# Patient Record
Sex: Male | Born: 1975 | Race: White | Hispanic: No | Marital: Married | Smoking: Current every day smoker
Health system: Southern US, Community
[De-identification: ages and names within clinical notes are randomized; demographics above are authoritative.]

## PROBLEM LIST (undated history)

## (undated) DIAGNOSIS — M5136 Other intervertebral disc degeneration, lumbar region: Secondary | ICD-10-CM

## (undated) DIAGNOSIS — M51369 Other intervertebral disc degeneration, lumbar region without mention of lumbar back pain or lower extremity pain: Secondary | ICD-10-CM

## (undated) HISTORY — PX: JOINT REPLACEMENT: SHX530

## (undated) HISTORY — PX: APPENDECTOMY: SHX54

---

## 2005-04-03 ENCOUNTER — Emergency Department: Payer: Self-pay | Admitting: Unknown Physician Specialty

## 2009-02-09 ENCOUNTER — Emergency Department: Payer: Self-pay | Admitting: Internal Medicine

## 2010-01-20 ENCOUNTER — Emergency Department: Payer: Self-pay | Admitting: Emergency Medicine

## 2010-12-06 ENCOUNTER — Emergency Department: Payer: Self-pay | Admitting: Unknown Physician Specialty

## 2012-11-14 ENCOUNTER — Emergency Department: Payer: Self-pay | Admitting: Emergency Medicine

## 2013-01-22 ENCOUNTER — Emergency Department: Payer: Self-pay | Admitting: Emergency Medicine

## 2013-04-16 ENCOUNTER — Emergency Department: Payer: Self-pay | Admitting: Internal Medicine

## 2013-08-06 ENCOUNTER — Emergency Department: Payer: Self-pay | Admitting: Emergency Medicine

## 2013-10-27 ENCOUNTER — Emergency Department: Payer: Self-pay | Admitting: Emergency Medicine

## 2014-02-09 ENCOUNTER — Emergency Department (HOSPITAL_COMMUNITY)
Admission: EM | Admit: 2014-02-09 | Discharge: 2014-02-09 | Disposition: A | Discharge status: Home / Self Care | Payer: Self-pay | Attending: Emergency Medicine | Admitting: Emergency Medicine

## 2014-02-09 ENCOUNTER — Encounter (HOSPITAL_COMMUNITY): Payer: Self-pay | Admitting: Emergency Medicine

## 2014-02-09 DIAGNOSIS — R269 Unspecified abnormalities of gait and mobility: Secondary | ICD-10-CM | POA: Insufficient documentation

## 2014-02-09 DIAGNOSIS — F17200 Nicotine dependence, unspecified, uncomplicated: Secondary | ICD-10-CM | POA: Insufficient documentation

## 2014-02-09 DIAGNOSIS — IMO0002 Reserved for concepts with insufficient information to code with codable children: Secondary | ICD-10-CM | POA: Insufficient documentation

## 2014-02-09 DIAGNOSIS — M5417 Radiculopathy, lumbosacral region: Secondary | ICD-10-CM

## 2014-02-09 MED ORDER — PREDNISONE 20 MG PO TABS: ORAL_TABLET | ORAL | Status: DC

## 2014-02-09 MED ORDER — OXYCODONE-ACETAMINOPHEN 5-325 MG PO TABS: 1.0000 | ORAL_TABLET | Freq: Four times a day (QID) | ORAL | Status: DC | PRN

## 2014-02-09 MED ORDER — METHOCARBAMOL 500 MG PO TABS: 1000.0000 mg | ORAL_TABLET | Freq: Four times a day (QID) | ORAL | Status: DC

## 2014-02-09 MED ORDER — OXYCODONE-ACETAMINOPHEN 5-325 MG PO TABS
1.0000 | ORAL_TABLET | Freq: Once | ORAL | Status: AC
  Administered 2014-02-09: 1 via ORAL
  Filled 2014-02-09: qty 1
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
## 2014-02-09 NOTE — Discharge Instructions (Signed)
Please read and follow all provided instructions.  Your diagnoses today include:  1. Lumbosacral radiculopathy     Tests performed today include:  Vital signs - see below for your results today  Medications prescribed:   Percocet (oxycodone/acetaminophen) - narcotic pain medication  DO NOT drive or perform any activities that require you to be awake and alert because this medicine can make you drowsy. BE VERY CAREFUL not to take multiple medicines containing Tylenol (also called acetaminophen). Doing so can lead to an overdose which can damage your liver and cause liver failure and possibly death.   Robaxin (methocarbamol) - muscle relaxer medication  DO NOT drive or perform any activities that require you to be awake and alert because this medicine can make you drowsy.    Prednisone - steroid medicine   It is best to take this medication in the morning to prevent sleeping problems. If you are diabetic, monitor your blood sugar closely and stop taking Prednisone if blood sugar is over 300. Take with food to prevent stomach upset.   Take any prescribed medications only as directed.  Home care instructions:   Follow any educational materials contained in this packet  Please rest, use ice or heat on your back for the next several days  Do not lift, push, pull anything more than 10 pounds for the next week  Follow-up instructions: Please follow-up with your primary care provider at the [facility] in the next 1 week for further evaluation of your symptoms.   Return instructions:  SEEK IMMEDIATE MEDICAL ATTENTION IF YOU HAVE:  New numbness, tingling, weakness, or problem with the use of your arms or legs  Severe back pain not relieved with medications  Loss control of your bowels or bladder  Increasing pain in any areas of the body (such as chest or abdominal pain)  Shortness of breath, dizziness, or fainting.   Worsening nausea (feeling sick to your stomach), vomiting, fever,  or sweats  Any other emergent concerns regarding your health   Additional Information:  Your vital signs today were: BP 134/86   Pulse 83   Temp(Src) 98.7 F (37.1 C) (Oral)   Resp 15   Ht 5\' 11"  (1.803 m)   Wt 186 lb 3.2 oz (84.46 kg)   BMI 25.98 kg/m2   SpO2 99% If your blood pressure (BP) was elevated above 135/85 this visit, please have this repeated by your doctor within one month. --------------

## 2014-02-09 NOTE — ED Notes (Signed)
PT reports Pain radiates down Lt leg to foot and LT small is numb . Pt also reports increased pain to back when sneezing or coughing.
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
## 2014-02-09 NOTE — ED Provider Notes (Signed)
Medical screening examination/treatment/procedure(s) were performed by non-physician practitioner and as supervising physician I was immediately available for consultation/collaboration.   EKG Interpretation None        Enid SkeensJoshua M Isahi Godwin, MD 02/09/14 1536
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
## 2014-02-09 NOTE — ED Notes (Signed)
Declined W/C at D/C and was escorted to lobby by RN. 

## 2014-02-16 ENCOUNTER — Emergency Department (HOSPITAL_COMMUNITY)
Admission: EM | Admit: 2014-02-16 | Discharge: 2014-02-16 | Discharge status: Against Medical Advice | Payer: Self-pay | Attending: Emergency Medicine | Admitting: Emergency Medicine

## 2014-02-16 ENCOUNTER — Encounter (HOSPITAL_COMMUNITY): Payer: Self-pay | Admitting: Emergency Medicine

## 2014-02-16 DIAGNOSIS — Y929 Unspecified place or not applicable: Secondary | ICD-10-CM | POA: Insufficient documentation

## 2014-02-16 DIAGNOSIS — F17200 Nicotine dependence, unspecified, uncomplicated: Secondary | ICD-10-CM | POA: Insufficient documentation

## 2014-02-16 DIAGNOSIS — Y939 Activity, unspecified: Secondary | ICD-10-CM | POA: Insufficient documentation

## 2014-02-16 DIAGNOSIS — T6391XA Toxic effect of contact with unspecified venomous animal, accidental (unintentional), initial encounter: Secondary | ICD-10-CM | POA: Insufficient documentation

## 2014-02-16 DIAGNOSIS — T63461A Toxic effect of venom of wasps, accidental (unintentional), initial encounter: Secondary | ICD-10-CM | POA: Insufficient documentation

## 2014-02-16 MED ORDER — ONDANSETRON 4 MG PO TBDP
8.0000 mg | ORAL_TABLET | Freq: Once | ORAL | Status: AC
  Administered 2014-02-16: 8 mg via ORAL

## 2014-02-16 MED ORDER — OXYCODONE-ACETAMINOPHEN 5-325 MG PO TABS: 1.0000 | ORAL_TABLET | Freq: Once | ORAL | Status: DC

## 2014-02-16 MED ORDER — ONDANSETRON 4 MG PO TBDP
ORAL_TABLET | ORAL | Status: AC
  Filled 2014-02-16: qty 2

## 2014-02-16 NOTE — ED Notes (Signed)
Pt reports being stung by appx 15 bees at 1445. States ever since he has generalized abdominal pain with N/V. 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 Redness noted to sting sites with minor swelling. States took 100 mg Benadryl at 1400. Pt speaks in complete sentences, denies any swelling to tongue or airway. NAD. AO x4.

## 2014-03-08 ENCOUNTER — Encounter (HOSPITAL_COMMUNITY): Payer: Self-pay | Admitting: Emergency Medicine

## 2014-03-08 ENCOUNTER — Emergency Department (HOSPITAL_COMMUNITY)
Admission: EM | Admit: 2014-03-08 | Discharge: 2014-03-08 | Disposition: A | Discharge status: Home / Self Care | Payer: Self-pay | Attending: Emergency Medicine | Admitting: Emergency Medicine

## 2014-03-08 DIAGNOSIS — R209 Unspecified disturbances of skin sensation: Secondary | ICD-10-CM | POA: Insufficient documentation

## 2014-03-08 DIAGNOSIS — Z9660 Presence of unspecified orthopedic joint implant: Secondary | ICD-10-CM | POA: Insufficient documentation

## 2014-03-08 DIAGNOSIS — IMO0002 Reserved for concepts with insufficient information to code with codable children: Secondary | ICD-10-CM | POA: Insufficient documentation

## 2014-03-08 DIAGNOSIS — M545 Low back pain, unspecified: Secondary | ICD-10-CM | POA: Insufficient documentation

## 2014-03-08 DIAGNOSIS — M79609 Pain in unspecified limb: Secondary | ICD-10-CM | POA: Insufficient documentation

## 2014-03-08 DIAGNOSIS — F17200 Nicotine dependence, unspecified, uncomplicated: Secondary | ICD-10-CM | POA: Insufficient documentation

## 2014-03-08 DIAGNOSIS — M5416 Radiculopathy, lumbar region: Secondary | ICD-10-CM

## 2014-03-08 DIAGNOSIS — Z79899 Other long term (current) drug therapy: Secondary | ICD-10-CM | POA: Insufficient documentation

## 2014-03-08 MED ORDER — HYDROMORPHONE HCL PF 1 MG/ML IJ SOLN
1.0000 mg | Freq: Once | INTRAMUSCULAR | Status: AC
  Administered 2014-03-08: 1 mg via INTRAVENOUS
  Filled 2014-03-08: qty 1

## 2014-03-08 MED ORDER — OXYCODONE-ACETAMINOPHEN 5-325 MG PO TABS: 1.0000 | ORAL_TABLET | Freq: Four times a day (QID) | ORAL | Status: DC | PRN

## 2014-03-08 NOTE — ED Notes (Signed)
Pt states he was moving furniture last night when he felt something pop in his back. He has hx of herniated disk in his back. Pt states he took a hot bath and some tylenol last night with no relief. Pt states pain is in both hips and down both legs with tingling in both feet and the left foot is almost numb.

## 2014-03-08 NOTE — ED Provider Notes (Signed)
CSN: [id number]     Arrival date & time 03/08/14  [id number] History   First MD Initiated Contact with Patient 03/08/14 0900     Chief Complaint  Patient presents with  . Leg Pain     (Consider location/radiation/quality/duration/timing/severity/associated sxs/prior Treatment) HPI Complains of low back pain radiating to both legs with tingling in left foot several weeks ago became worse last night after he pushed a heavy dresser. No loss of bladder or bowel control no other associated symptoms he treated himself with Tylenol and ibuprofen, without relief. No fever. No other associated symptoms pain is worse with movement improved with remaining still. History reviewed. No pertinent past medical history. Past Surgical History  Procedure Laterality Date  . Appendectomy    . Joint replacement     lumbar radiculopathy History reviewed. No pertinent family history. History  Substance Use Topics  . Smoking status: Current Every Day Smoker -- 0.50 packs/day    Types: Cigarettes  . Smokeless tobacco: Not on file  . Alcohol Use: No   no illicit drug use  Review of Systems  Constitutional: Negative.   HENT: Negative.   Respiratory: Negative.   Cardiovascular: Negative.   Gastrointestinal: Negative.   Musculoskeletal: Positive for back pain.  Skin: Negative.   Neurological: Positive for numbness.  Psychiatric/Behavioral: Negative.   All other systems reviewed and are negative.     Allergies  Review of patient's allergies indicates no known allergies.  Home Medications   Prior to Admission medications   Medication Sig Start Date End Date Taking? Authorizing Provider  acetaminophen (TYLENOL) 500 MG tablet Take 1,000 mg by mouth every 6 (six) hours as needed for moderate pain.   Yes Historical Provider, MD   BP 119/86  Pulse 99  Temp(Src) 98.2 F (36.8 C) (Oral)  SpO2 99% Physical Exam  Nursing note and vitals reviewed. Constitutional: He appears well-developed and  well-nourished. He appears distressed.  Appears uncomfortable Glasgow Coma Score 15  HENT:  Head: Normocephalic and atraumatic.  Eyes: Conjunctivae are normal. Pupils are equal, round, and reactive to light.  Neck: Neck supple. No tracheal deviation present. No thyromegaly present.  Cardiovascular: Normal rate and regular rhythm.   No murmur heard. Pulmonary/Chest: Effort normal and breath sounds normal.  Abdominal: Soft. Bowel sounds are normal. He exhibits no distension. There is no tenderness.  Musculoskeletal: Normal range of motion. He exhibits tenderness. He exhibits no edema.  Tender over lumbar area. No tenderness over thoracic or cervical areas at all 4 extremities no redness or tenderness neurovascular intact  Neurological: He is alert. He has normal reflexes. Coordination normal.  DTRs symmetric bilaterally knee jerk ankle jerk and biceps was ordered bilaterally. Walks without limp. Walks slightly flexed at waist  Skin: Skin is warm and dry. No rash noted.  Psychiatric: He has a normal mood and affect.    ED Course  Procedures (including critical care time) Labs Review Labs Reviewed - No data to display 11:10 AM pain improved after treatment with intravenous hydromorphone. Requesting more pain medicine. Additional hydromorphone ordered. Imaging Review No results found.   EKG Interpretation None     11:40 AM patient feels much improved and ready to go home MDM  Prescription Percocet, referral to wellness Center and resource guide. Final diagnoses:  None   diagnosis lumbar radiculopathy      Doug SouSam Madisan Bice, MD 03/08/14 1148

## 2014-03-08 NOTE — Discharge Instructions (Signed)
Take Advil for mild pain or the pain medicine prescribed for bad pain. Call the wellness Center tomorrow or any of the numbers on the resource guide to get a primary care physician. Or you can continue to try to get a primary care physician with the [facility]   Emergency Department Resource Guide 1) Find a Doctor and Pay Out of Pocket Although you won't have to find out who is covered by your insurance plan, it is a good idea to ask around and get recommendations. You will then need to call the office and see if the doctor you have chosen will accept you as a new patient and what types of options they offer for patients who are self-pay. Some doctors offer discounts or will set up payment plans for their patients who do not have insurance, but you will need to ask so you aren't surprised when you get to your appointment.  2) Contact Your Local Health Department Not all health departments have doctors that can see patients for sick visits, but many do, so it is worth a call to see if yours does. If you don't know where your local health department is, you can check in your phone book. The CDC also has a tool to help you locate your state's health department, and many state websites also have listings of all of their local health departments.  3) Find a Walk-in Clinic If your illness is not likely to be very severe or complicated, you may want to try a walk in clinic. These are popping up all over the country in pharmacies, drugstores, and shopping centers. They're usually staffed by nurse practitioners or physician assistants that have been trained to treat common illnesses and complaints. They're usually fairly quick and inexpensive. However, if you have serious medical issues or chronic medical problems, these are probably not your best option.  No Primary Care Doctor: - Call Health Connect at  [phone] - they can help you locate a primary care doctor that  accepts your insurance, provides certain  services, etc. - Physician Referral Service- [phone]  Chronic Pain Problems: Organization         Address  Phone   Notes  [facility]  [phone] Patients need to be referred by their primary care doctor.   Medication Assistance: Organization         Address  Phone   Notes  [facility] [address] [place], [place] [address] [phone] --Must be a resident of [place] -- Must have NO insurance coverage whatsoever (no Medicaid/ Medicare, etc.) -- The pt. MUST have a primary care doctor that directs their care regularly and follows them in the community   MedAssist  [phone]   [employer]  [phone]    Agencies that provide inexpensive medical care: Organization         Address  Phone   Notes  [facility]  [phone]   [facility]    [phone]   [facility] [address] [place], [place] [address] [phone]   [facility] [address], [place] [phone]   Planned Parenthood    [phone]   [facility]    [phone]   [facility]  [address], [place] Phone:  [phone]) [phone], Fax:  [phone] Hours of Operation:  9 am -  6 pm, M-F.  Also accepts Medicaid/Medicare and self-pay.  [facility] [place], [address], [place] Phone: [phone], Fax: [phone]. Hours of Operation:  8:30 am - 5:30 pm, M-F.  Also accepts Medicaid and self-pay.  [facility] [place] [address] [place] Phone: [phone]   [place], [place], [place] [phone], Ext. [phone] Mondays & Thursdays: 7-9 AM.  First 15 patients are seen on a first come, first serve basis.    [place] Providers:  Organization         Address  Phone   Notes  [facility] [address], [place] [phone] Also accepts self-pay patients.  [facility] [address], [place]  [phone]   [place], [address], [place] [phone]   [facility] [address] [place] [phone]   Lucianne Lei [address], [place]   [phone] Only accepts [place] Access [place] patients after they have their name applied to their card.   Self-Pay (no insurance) in [facility]:  Organization         Address  Phone   Notes  Sickle Cell Patients, [facility] [place] [phone]   [facility] [place] [phone]   [facility] [place]  [place], [place],  [phone]   [facility]/Dr. Osei-Bonsu  [address] [place] or [address], [place] [phone] Phone number for both [place] and [place] locations is the same.  [facility] [address] [place] [phone]   [facility] [address], [place] or [address] [phone]   [facility] [address], [place] [phone], phone; [phone], fax Sees patients 1st and 3rd Saturday of every month.  Must not qualify for public or private insurance (i.e. Medicaid, Medicare, [place] Health Choice, Veterans' Benefits)  Household income should be no more than 200% of the poverty level The clinic cannot treat you if you are pregnant or think you are pregnant  Sexually transmitted diseases are not treated at the clinic.    Dental Care: Organization         Address  Phone  Notes  [facility] Department of [facility] [place] [phone] Accepts  children up to age 4 who are enrolled in [place] or [place]; pregnant women with a Medicaid card; and children who have applied for Medicaid or [place] Health Choice, but were declined, whose parents can pay a reduced fee at time of service.  [facility]  [address], [place] [phone] Accepts children up to age 26 who are enrolled in [place] or [place]; pregnant women with a Medicaid card; and children who have applied for Medicaid or  Health Choice, but were declined, whose parents can pay a reduced fee at time of service.  [place] [facility] Access PROGRAM  [place] [phone] Patients are seen by appointment only. Walk-ins are not accepted. [place] will see patients 72 years of age and  older. Monday - Tuesday (8am-5pm) Most Wednesdays (8:30-5pm) $30 per visit, cash only  [facility] [employer] PROGRAM  [address], [facility] [phone] Patients are seen by appointment only. Walk-ins are not accepted. [place] will see patients 87 years of age and older. One Wednesday Evening (Monthly: Volunteer Based).  $30 per visit, cash only  [place]  [phone] for adults; Children under age 77, call Graduate Pediatric Dentistry at [phone]. Children aged 21-14, please call [phone] to request a pediatric application.  Dental services are provided in all areas of dental care including fillings, crowns and bridges, complete and partial dentures, implants, gum treatment, root canals, and extractions. Preventive care is also provided. Treatment is provided to both adults and children. Patients are selected via a lottery and there is often a waiting list.   [facility] [address], [place]  [phone] [email or web address]   [facility] [address] [place], [place] [phone], Ext. [phone] Second and  Fourth Thursday of each month, opens at 6:30 AM; Clinic ends at 9 AM.  Patients are seen on a first-come first-served basis, and a limited number are seen during each clinic.   [facility]  [address] Hillard Danker [place], [place] [phone]   Eligibility Requirements You must have lived in [place], [place], or [place] counties for at least the last three months.   You cannot be eligible for state or federal sponsored [employer], including [employer], [place], or [employer].   You generally cannot be eligible for healthcare insurance through your employer.    How to apply: Eligibility screenings are held every Tuesday and Wednesday afternoon from 1:00 pm until 4:00 pm. You do not need an appointment for the interview!  [facility] [address] Olivia Lopez de [place], [place]   [place]  [place] Department  [place]  [phone]    Behavioral Health Resources in the Community: Intensive Outpatient Programs Organization         Address  Phone  Notes  Bryant Seguin. [address], [place], [place] [phone]   [facility] Outpatient [address], [place], [place]   ADS: [facility] Drug S[facility]cs [address] [place], [place]   [place] [address]  [place], [place] or [phone]   Substance Abuse Resources Organization         Address  Phone  Notes  Alcohol and Drug Services  [phone]   [place]  [phone]   [facility] [place]   Chinita Pester  [phone]   Residential & Outpatient Substance Abuse Program  [phone]   Psychological Services Organization         Address  Phone  Notes  [facility] [place]  Vittoria Noreen  [phone]   [place]  [address] [place] [phone] or [phone]    Mobile Crisis Teams Organization         Address  Phone  Notes  [facility], Mobile Crisis Care Unit  [phone]   [facility]  [address] [place], [place]   [facility] [address], [place] [phone]    Self-Help/Support Groups Organization         Address  Phone             Notes  [facility] [place] - variety of support groups  [place] Call for more information  Narcotics Anonymous (NA), [facility] [address], [employer]   2 meetings at this location   Special educational needs teacher         Address  Phone  Notes  ASAP Residential Treatment [place],    [place]  [phone]   [facility]  [address], [place] [address], [place], [place]   [place], [place] [phone] Admissions: 8am-3pm M-F  Incentives Substance [place] [address],    [place], [place] [phone]   [facility] [address] [place], [place], [place]   [facility] [address],  [place], [place]   [facility] - Intensive Outpatient [address], [place], [place]   [facility] [place] ([address] [place]  [place], [place] [phone] or [phone]   [facility] (RTS) [address] [place], [place] Accepts Medicaid  [facility] [place] [address],  [place] [phone] Substance Abuse/Addiction Treatment   [facility] Lopez Organization         Address  Phone  Notes  [facility]  [phone]   Domenic Schwab, PhD [address] Arlis Porta [place], [place]   [phone] or [phone]   [place], [place] [phone]   [facility] [address] [place], [place] [phone] Insurance/Medicaid/sponsorship through [facility] and Families [address], [address]                                    [place], [place] [phone] [place] [address], [place] [phone]    Dr. Adele Schilder  [phone]   [facility] Dept. 1) [address] [place] 2) [place] 3)  [place] [address], [place] [phone]  [phone]   [place] [phone] or [phone] (After Hours)

## 2014-10-06 ENCOUNTER — Encounter (HOSPITAL_COMMUNITY): Payer: Self-pay | Admitting: *Deleted

## 2014-10-06 ENCOUNTER — Emergency Department (HOSPITAL_COMMUNITY)
Admission: EM | Admit: 2014-10-06 | Discharge: 2014-10-06 | Disposition: A | Discharge status: Home / Self Care | Payer: Self-pay | Attending: Emergency Medicine | Admitting: Emergency Medicine

## 2014-10-06 DIAGNOSIS — G8929 Other chronic pain: Secondary | ICD-10-CM | POA: Insufficient documentation

## 2014-10-06 DIAGNOSIS — R202 Paresthesia of skin: Secondary | ICD-10-CM | POA: Insufficient documentation

## 2014-10-06 DIAGNOSIS — M5416 Radiculopathy, lumbar region: Secondary | ICD-10-CM | POA: Insufficient documentation

## 2014-10-06 DIAGNOSIS — Z720 Tobacco use: Secondary | ICD-10-CM | POA: Insufficient documentation

## 2014-10-06 LAB — CBG MONITORING, ED: Glucose-Capillary: 105 mg/dL — ABNORMAL HIGH (ref 70–99)

## 2014-10-06 MED ORDER — ORPHENADRINE CITRATE 30 MG/ML IJ SOLN
60.0000 mg | Freq: Two times a day (BID) | INTRAMUSCULAR | Status: DC
Start: 2014-10-06 — End: 2014-10-06

## 2014-10-06 MED ORDER — CYCLOBENZAPRINE HCL 10 MG PO TABS
10.0000 mg | ORAL_TABLET | Freq: Once | ORAL | Status: AC
  Administered 2014-10-06: 10 mg via ORAL
  Filled 2014-10-06: qty 1

## 2014-10-06 MED ORDER — HYDROMORPHONE HCL 1 MG/ML IJ SOLN
1.0000 mg | Freq: Once | INTRAMUSCULAR | Status: AC
  Administered 2014-10-06: 1 mg via INTRAMUSCULAR
  Filled 2014-10-06: qty 1

## 2014-10-06 MED ORDER — IBUPROFEN 600 MG PO TABS: 600.0000 mg | ORAL_TABLET | Freq: Three times a day (TID) | ORAL | Status: DC | PRN

## 2014-10-06 MED ORDER — IBUPROFEN 400 MG PO TABS
600.0000 mg | ORAL_TABLET | Freq: Once | ORAL | Status: AC
  Administered 2014-10-06: 600 mg via ORAL
  Filled 2014-10-06 (×2): qty 1

## 2014-10-06 MED ORDER — OXYCODONE-ACETAMINOPHEN 5-325 MG PO TABS: 1.0000 | ORAL_TABLET | ORAL | Status: DC | PRN

## 2014-10-06 NOTE — Discharge Instructions (Signed)

## 2014-10-06 NOTE — ED Notes (Signed)
Pt in from home c/o L leg & L foot numbness & tingling onset x 3 days, hx of the same, pt eval for similar symptoms & advised to return for worsening symptoms, pt denies bowel  & bladder incontinence, pt ambulatory

## 2014-10-06 NOTE — ED Notes (Signed)
MD at bedside. 
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
## 2014-10-06 NOTE — ED Notes (Signed)
CBG 105 

## 2014-12-28 ENCOUNTER — Emergency Department (HOSPITAL_COMMUNITY)
Admission: EM | Admit: 2014-12-28 | Discharge: 2014-12-28 | Disposition: A | Discharge status: Home / Self Care | Payer: Self-pay | Attending: Emergency Medicine | Admitting: Emergency Medicine

## 2014-12-28 ENCOUNTER — Encounter (HOSPITAL_COMMUNITY): Payer: Self-pay | Admitting: *Deleted

## 2014-12-28 DIAGNOSIS — Z720 Tobacco use: Secondary | ICD-10-CM | POA: Insufficient documentation

## 2014-12-28 DIAGNOSIS — M25511 Pain in right shoulder: Secondary | ICD-10-CM | POA: Insufficient documentation

## 2014-12-28 DIAGNOSIS — M5442 Lumbago with sciatica, left side: Secondary | ICD-10-CM | POA: Insufficient documentation

## 2014-12-28 MED ORDER — IBUPROFEN 800 MG PO TABS: 800.0000 mg | ORAL_TABLET | Freq: Three times a day (TID) | ORAL | Status: DC

## 2014-12-28 MED ORDER — METHOCARBAMOL 500 MG PO TABS: 500.0000 mg | ORAL_TABLET | Freq: Two times a day (BID) | ORAL | Status: DC

## 2014-12-28 NOTE — ED Notes (Signed)
Back pain started in July of 2015 . Pt has not been able to follow up with a neiro MD yet. 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

## 2014-12-28 NOTE — ED Provider Notes (Signed)
CSN: [id number]     Arrival date & time 12/28/14  2130 History   First MD Initiated Contact with Patient 12/28/14 0801     Chief Complaint  Patient presents with  . Shoulder Pain  . Back Pain  . Leg Pain     (Consider location/radiation/quality/duration/timing/severity/associated sxs/prior Treatment) HPI Comments: Patient presents today with a complaints of lower back pain and right shoulder pain.  He reports that the pain has been present for the past 10 months, but is progressively worsening.  He reports that the pain is worse when standing for long periods of time.  He denies acute injury or trauma.  Back pain radiates down the left leg.  Pain associated some some intermittent numbness/tingling of the left leg.  He has taken Advil and Aleve for the pain without relief.  He has been seen in the ED for the same in the past and given referral to Orthopedics, but he states that he has not seen the Orthopedist.  He denies saddle anaesthesia, bowel/bladder incontinence, fever, chills, or other urinary symptoms.    He is also complaining of pain of the right shoulder that has been present for several months.  No acute injury or trauma.  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  Pain is worse when he is at work painting.  Pain does not radiate.      The history is provided by the patient.    History reviewed. No pertinent past medical history. Past Surgical History  Procedure Laterality Date  . Appendectomy    . Joint replacement     History reviewed. No pertinent family history. History  Substance Use Topics  . Smoking status: Current Every Day Smoker -- 0.50 packs/day    Types: Cigarettes  . Smokeless tobacco: Not on file  . Alcohol Use: No    Review of Systems  All other systems reviewed and are negative.     Allergies  Review of patient's allergies indicates no known allergies.  Home Medications   Prior to Admission medications   Medication  Sig Start Date End Date Taking? Authorizing Provider  acetaminophen (TYLENOL) 500 MG tablet Take 1,000 mg by mouth every 6 (six) hours as needed for moderate pain.    Historical Provider, MD  ibuprofen (ADVIL,MOTRIN) 600 MG tablet Take 1 tablet (600 mg total) by mouth every 8 (eight) hours as needed. 10/06/14   Shaune Pollack, MD  oxyCODONE-acetaminophen (PERCOCET/ROXICET) 5-325 MG per tablet Take 1-2 tablets by mouth every 4 (four) hours as needed for severe pain. 10/06/14   Shaune Pollack, MD   BP 125/83 mmHg  Pulse 90  Temp(Src) 97.7 F (36.5 C) (Oral)  Resp 18  SpO2 100% Physical Exam  Constitutional: He appears well-developed and well-nourished.  HENT:  Head: Normocephalic and atraumatic.  Neck: Normal range of motion. Neck supple.  Cardiovascular: Normal rate, regular rhythm and normal heart sounds.   Pulses:      Radial pulses are 2+ on the right side, and 2+ on the left side.       Dorsalis pedis pulses are 2+ on the right side, and 2+ on the left side.  Pulmonary/Chest: Effort normal and breath sounds normal.  Musculoskeletal:       Right shoulder: He exhibits decreased range of motion and tenderness. He exhibits no swelling, no effusion and no deformity.       Lumbar back: He exhibits tenderness and bony tenderness. He exhibits no swelling,  no edema and no deformity.  Pain with abduction and flexion of the right shoulder.  No erythema, edema, or warmth of the shoulder.    Neurological: He is alert. He has normal strength. No sensory deficit. Gait normal.  Reflex Scores:      Patellar reflexes are 2+ on the right side and 2+ on the left side. Distal sensation of both feet intact Muscle strength 5/5 LE bilaterally  Skin: Skin is warm and dry.  Psychiatric: He has a normal mood and affect.  Nursing note and vitals reviewed.   ED Course  Procedures (including critical care time) Labs Review Labs Reviewed - No data to display  Imaging Review No results found.   EKG  Interpretation None      MDM   Final diagnoses:  None   Patient with back pain.  No neurological deficits and normal neuro exam.  Patient can walk but states is painful.  No loss of bowel or bladder control.  No concern for cauda equina.  No fever, night sweats, weight loss, h/o cancer, IVDU.  RICE protocol discussed with pt.  Patient also presenting with right shoulder pain.  No acute injury or trauma.  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  No signs of infection.  Patient given NSAIDs and referral to Orthopedics.  Patient stable for discharge.  Return precautions given.       Santiago GladHeather Yonael Tulloch, PA-C 12/28/14 2157  Richardean Canalavid H Yao, MD 12/29/14 [phone]

## 2014-12-28 NOTE — ED Notes (Signed)
Declined W/C at D/C and was escorted to lobby by RN. 

## 2015-02-11 ENCOUNTER — Emergency Department
Admission: EM | Admit: 2015-02-11 | Discharge: 2015-02-11 | Disposition: A | Discharge status: Home / Self Care | Payer: Self-pay | Attending: Emergency Medicine | Admitting: Emergency Medicine

## 2015-02-11 ENCOUNTER — Encounter: Payer: Self-pay | Admitting: Emergency Medicine

## 2015-02-11 ENCOUNTER — Emergency Department: Payer: Self-pay

## 2015-02-11 DIAGNOSIS — Y92828 Other wilderness area as the place of occurrence of the external cause: Secondary | ICD-10-CM | POA: Insufficient documentation

## 2015-02-11 DIAGNOSIS — S300XXA Contusion of lower back and pelvis, initial encounter: Secondary | ICD-10-CM | POA: Insufficient documentation

## 2015-02-11 DIAGNOSIS — Y9389 Activity, other specified: Secondary | ICD-10-CM | POA: Insufficient documentation

## 2015-02-11 DIAGNOSIS — Y998 Other external cause status: Secondary | ICD-10-CM | POA: Insufficient documentation

## 2015-02-11 DIAGNOSIS — W228XXA Striking against or struck by other objects, initial encounter: Secondary | ICD-10-CM | POA: Insufficient documentation

## 2015-02-11 DIAGNOSIS — Z720 Tobacco use: Secondary | ICD-10-CM | POA: Insufficient documentation

## 2015-02-11 HISTORY — DX: Other intervertebral disc degeneration, lumbar region without mention of lumbar back pain or lower extremity pain: M51.369

## 2015-02-11 HISTORY — DX: Other intervertebral disc degeneration, lumbar region: M51.36

## 2015-02-11 MED ORDER — CYCLOBENZAPRINE HCL 10 MG PO TABS: 10.0000 mg | ORAL_TABLET | Freq: Three times a day (TID) | ORAL | Status: DC | PRN

## 2015-02-11 MED ORDER — KETOROLAC TROMETHAMINE 60 MG/2ML IM SOLN
INTRAMUSCULAR | Status: AC
  Administered 2015-02-11: 60 mg via INTRAMUSCULAR
  Filled 2015-02-11: qty 2

## 2015-02-11 MED ORDER — KETOROLAC TROMETHAMINE 60 MG/2ML IM SOLN
60.0000 mg | Freq: Once | INTRAMUSCULAR | Status: AC
  Administered 2015-02-11: 60 mg via INTRAMUSCULAR

## 2015-02-11 MED ORDER — HYDROCODONE-ACETAMINOPHEN 5-325 MG PO TABS: 1.0000 | ORAL_TABLET | ORAL | Status: DC | PRN

## 2015-02-11 MED ORDER — HYDROCODONE-ACETAMINOPHEN 5-325 MG PO TABS
2.0000 | ORAL_TABLET | Freq: Once | ORAL | Status: AC
  Administered 2015-02-11: 2 via ORAL

## 2015-02-11 MED ORDER — IBUPROFEN 800 MG PO TABS: 800.0000 mg | ORAL_TABLET | Freq: Three times a day (TID) | ORAL | Status: DC | PRN

## 2015-02-11 MED ORDER — HYDROCODONE-ACETAMINOPHEN 5-325 MG PO TABS
ORAL_TABLET | ORAL | Status: AC
  Administered 2015-02-11: 2 via ORAL
  Filled 2015-02-11: qty 2
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
## 2015-02-11 NOTE — Discharge Instructions (Signed)
Tailbone Injury The tailbone (coccyx) is the small bone at the lower end of the spine. A tailbone injury may involve stretched ligaments, bruising, or a broken bone (fracture). Women are more vulnerable to this injury due to having a wider pelvis. CAUSES  This type of injury typically occurs from falling and landing on the tailbone. Repeated strain or friction from actions such as rowing and bicycling may also injure the area. The tailbone can be injured during childbirth. Infections or tumors may also press on the tailbone and cause pain. Sometimes, the cause of injury is unknown. SYMPTOMS   Bruising.  Pain when sitting.  Painful bowel movements.  In women, pain during intercourse. DIAGNOSIS  Your caregiver can diagnose a tailbone injury based on your symptoms and a physical exam. X-rays may be taken if a fracture is suspected. Your caregiver may also use an MRI scan imaging test to evaluate your symptoms. TREATMENT  Your caregiver may prescribe medicines to help relieve your pain. Most tailbone injuries heal on their own in 4 to 6 weeks. However, if the injury is caused by an infection or tumor, the recovery period may vary. PREVENTION  Wear appropriate padding and sports gear when bicycling and rowing. This can help prevent an injury from repeated strain or friction. HOME CARE INSTRUCTIONS   Put ice on the injured area.  Put ice in a plastic bag.  Place a towel between your skin and the bag.  Leave the ice on for 15-20 minutes, every hour while awake for the first 1 to 2 days.  Sit on a large, rubber or inflated ring or cushion to ease your pain. Lean forward when sitting to help decrease discomfort.  Avoid sitting for long periods of time.  Increase your activity as the pain allows.  Only take over-the-counter or prescription medicines for pain, discomfort, or fever as directed by your caregiver.  You may use stool softeners if it is painful to have a bowel movement, or as  directed by your caregiver.  Eat a diet with plenty of fiber to help prevent constipation.  Keep all follow-up appointments as directed by your caregiver. SEEK MEDICAL CARE IF:   Your pain becomes worse.  Your bowel movements cause a great deal of discomfort.  You are unable to have a bowel movement.  You have a fever. MAKE SURE YOU:  Understand these instructions.  Will watch your condition.  Will get help right away if you are not doing well or get worse. Document Released: 07/21/2000 Document Revised: 10/16/2011 Document Reviewed: 02/16/2011 [facility] Patient Information 2015 [place]. This information is not intended to replace advice given to you by your health care provider. Make sure you discuss any questions you have with your health care provider.  Iliac Crest Contusion  An iliac crest contusion is a deep bruise of your hip bone (hip pointer). Contusions happen when an injury causes bleeding under the skin. Signs of bruising include pain, puffiness (swelling), and discolored skin. The contusion may turn blue, purple, or yellow. HOME CARE   Put ice on the injured area.  Put ice in a plastic bag.  Place a towel between your skin and the bag.  Leave the ice on for 15-20 minutes, 03-04 times a day.  Only take medicines as told by your doctor.  Keep your leg straight (extended) when possible.  Walk and move around as pain allows, or as told by your doctor. Use crutches if you are told to do so.  Put  on an elastic wrap as told by your doctor. You can take it off for sleeping, showers, and baths. GET HELP RIGHT AWAY IF:  You have more bruising or puffiness.  You have pain that is getting worse.  Your puffiness or pain is not helped by medicines.  Your toes get cold. MAKE SURE YOU:   Understand these instructions.  Will watch your condition.  Will get help right away if you are not doing well or get worse. Document Released: 07/13/2011 Document  Revised: 01/23/2012 Document Reviewed: 07/13/2011 [facility] Patient Information 2015 [place], [place]. This information is not intended to replace advice given to you by your health care provider. Make sure you discuss any questions you have with your health care provider.

## 2015-05-09 ENCOUNTER — Encounter (HOSPITAL_COMMUNITY): Payer: Self-pay

## 2015-05-09 ENCOUNTER — Emergency Department (HOSPITAL_COMMUNITY)
Admission: EM | Admit: 2015-05-09 | Discharge: 2015-05-09 | Disposition: A | Discharge status: Home / Self Care | Payer: Self-pay | Attending: Emergency Medicine | Admitting: Emergency Medicine

## 2015-05-09 DIAGNOSIS — Z720 Tobacco use: Secondary | ICD-10-CM | POA: Insufficient documentation

## 2015-05-09 DIAGNOSIS — Y9289 Other specified places as the place of occurrence of the external cause: Secondary | ICD-10-CM | POA: Insufficient documentation

## 2015-05-09 DIAGNOSIS — S61411A Laceration without foreign body of right hand, initial encounter: Secondary | ICD-10-CM | POA: Insufficient documentation

## 2015-05-09 DIAGNOSIS — Z23 Encounter for immunization: Secondary | ICD-10-CM | POA: Insufficient documentation

## 2015-05-09 DIAGNOSIS — Y9389 Activity, other specified: Secondary | ICD-10-CM | POA: Insufficient documentation

## 2015-05-09 DIAGNOSIS — Y998 Other external cause status: Secondary | ICD-10-CM | POA: Insufficient documentation

## 2015-05-09 DIAGNOSIS — W25XXXA Contact with sharp glass, initial encounter: Secondary | ICD-10-CM | POA: Insufficient documentation

## 2015-05-09 MED ORDER — IBUPROFEN 200 MG PO TABS: 600.0000 mg | ORAL_TABLET | Freq: Once | ORAL | Status: DC

## 2015-05-09 MED ORDER — TETANUS-DIPHTH-ACELL PERTUSSIS 5-2.5-18.5 LF-MCG/0.5 IM SUSP
0.5000 mL | Freq: Once | INTRAMUSCULAR | Status: AC
  Administered 2015-05-09: 0.5 mL via INTRAMUSCULAR
  Filled 2015-05-09: qty 0.5

## 2015-05-09 MED ORDER — LIDOCAINE-EPINEPHRINE (PF) 2 %-1:200000 IJ SOLN
20.0000 mL | Freq: Once | INTRAMUSCULAR | Status: DC
  Filled 2015-05-09: qty 20

## 2015-05-09 MED ORDER — OXYCODONE-ACETAMINOPHEN 5-325 MG PO TABS
1.0000 | ORAL_TABLET | Freq: Once | ORAL | Status: AC
  Administered 2015-05-09: 1 via ORAL
  Filled 2015-05-09: qty 1

## 2015-05-09 NOTE — ED Notes (Signed)
Suture cart is present at the bedside.

## 2015-05-09 NOTE — ED Notes (Signed)
Suturing completed by kaitlyn, pa-c. Preparing for discharge.

## 2015-05-09 NOTE — ED Notes (Signed)
Pt has a laceration to the right hand between thumb and index finger after getting cut on a glass cup.

## 2015-05-09 NOTE — ED Notes (Signed)
Wound unwrapped at this time and bleeding has been controlled by pt. Rewrapped with gauze and coban

## 2015-05-09 NOTE — ED Provider Notes (Signed)
5:19 AM   LACERATION REPAIR Performed by: Emilia Beck Authorized by: Emilia Beck Consent: Verbal consent obtained. Risks and benefits: risks, benefits and alternatives were discussed Consent given by: patient Patient identity confirmed: provided demographic data Prepped and Draped in normal sterile fashion Wound explored  Laceration Location: right hand  Laceration Length: 3 cm  No Foreign Bodies seen or palpated  Anesthesia: local infiltration  Local anesthetic: lidocaine 1% with epinephrine  Anesthetic total: 0.5 ml  Irrigation method: syringe Amount of cleaning: standard  Skin closure: 4-0 prolene  Number of sutures: 2  Technique: simple  Patient tolerance: Patient tolerated the procedure well with no immediate complications.   Emilia Beck, PA-C 05/09/15 2956  [name], MD 05/10/15 [phone]

## 2015-05-09 NOTE — Discharge Instructions (Signed)

## 2015-05-09 NOTE — ED Provider Notes (Signed)
CSN: [id number]     Arrival date & time 05/09/15  0123 History  By signing my name below, I, Omar Fernandez, attest that this documentation has been prepared under the direction and in the presence of Omar Divine, MD. Electronically Signed: Angelene Fernandez, ED Scribe. 05/09/2015. 3:46 AM.    Chief Complaint  Patient presents with  . Extremity Laceration   Patient is a 39 y.o. male presenting with skin laceration. The history is provided by the patient. No language interpreter was used.  Laceration Location:  Hand Hand laceration location:  R hand Depth:  Through dermis Bleeding: controlled   Time since incident:  2 hours Laceration mechanism:  Broken glass Pain details:    Severity:  Moderate   Timing:  Constant   Progression:  Worsening Foreign body present:  No foreign bodies Relieved by:  None tried Worsened by:  Nothing tried Ineffective treatments:  None tried Tetanus status:  Up to date  HPI Comments: Omar Fernandez is a 39 y.o. male who presents to the Emergency Department status post right hand injury that occurred about 2 hours ago. He reports associated laceration between his right thumb and right index finger. He reports that he was setting a glass on the counter when he cut his hand on the glass cup as it shattered. He states that is right handed. He states that he received a Tetanus vaccine here upon arrival. Pt has soaked his hand in water.   Past Medical History  Diagnosis Date  . Degenerative disc disease, lumbar    Past Surgical History  Procedure Laterality Date  . Appendectomy    . Joint replacement     No family history on file. Social History  Substance Use Topics  . Smoking status: Current Every Day Smoker -- 0.50 packs/day    Types: Cigarettes  . Smokeless tobacco: None  . Alcohol Use: No    Review of Systems  Skin: Positive for wound.  All other systems reviewed and are negative.     Allergies  Review of patient's allergies  indicates no known allergies.  Home Medications   Prior to Admission medications   Medication Sig Start Date End Date Taking? Authorizing Provider  cyclobenzaprine (FLEXERIL) 10 MG tablet Take 1 tablet (10 mg total) by mouth every 8 (eight) hours as needed for muscle spasms. 02/11/15   Omar Dakin, PA-C  HYDROcodone-acetaminophen (NORCO) 5-325 MG per tablet Take 1-2 tablets by mouth every 4 (four) hours as needed for moderate pain. 02/11/15   Omar Sheer Beers, PA-C  ibuprofen (ADVIL,MOTRIN) 800 MG tablet Take 1 tablet (800 mg total) by mouth every 8 (eight) hours as needed. 02/11/15   Omar Sheer Beers, PA-C   BP 129/97 mmHg  Pulse 65  Temp(Src) 97.8 F (36.6 C) (Oral)  Resp 20  Ht 6' (1.829 m)  Wt 210 lb (95.255 kg)  BMI 28.47 kg/m2  SpO2 99% Physical Exam  Constitutional: He is oriented to person, place, and time. He appears well-developed and well-nourished. No distress.  HENT:  Head: Normocephalic and atraumatic.  Eyes: Conjunctivae are normal. No scleral icterus.  Neck: Neck supple.  Cardiovascular: Normal rate and intact distal pulses.   Pulmonary/Chest: Effort normal. No stridor. No respiratory distress.  Abdominal: Normal appearance. He exhibits no distension.  Musculoskeletal:       Arms: Neurological: He is alert and oriented to person, place, and time.  Skin: Skin is warm and dry. No rash noted.  Psychiatric: He has a normal mood  and affect. His behavior is normal.  Nursing note and vitals reviewed.   ED Course  Procedures (including critical care time) DIAGNOSTIC STUDIES: Oxygen Saturation is 99% on RA, normal by my interpretation.    COORDINATION OF CARE: 3:30 AM- Pt advised of plan for treatment and pt agrees.    Labs Review Labs Reviewed - No data to display  Imaging Review No results found. Omar Divine, MD has personally reviewed and evaluated these images and lab results as part of his medical decision-making.   EKG Interpretation None      MDM    Final diagnoses:  Hand laceration, right, initial encounter    Lac repaired by PA Omar Fernandez.    I personally performed the services described in this documentation, which was scribed in my presence. The recorded information has been reviewed and is accurate.     Omar Divine, MD 05/09/15 [phone]

## 2015-05-09 NOTE — ED Notes (Signed)
Spoke to [place], [place], in regards to suturing. Cart prepared and patient informed of plan of care.

## 2015-05-09 NOTE — ED Notes (Signed)
Dr. wofford at the bedside 

## 2016-01-20 ENCOUNTER — Encounter: Payer: Self-pay | Admitting: Medical Oncology

## 2016-01-20 ENCOUNTER — Emergency Department: Payer: Self-pay

## 2016-01-20 ENCOUNTER — Emergency Department
Admission: EM | Admit: 2016-01-20 | Discharge: 2016-01-20 | Disposition: A | Discharge status: Home / Self Care | Payer: Self-pay | Attending: Emergency Medicine | Admitting: Emergency Medicine

## 2016-01-20 DIAGNOSIS — Y9353 Activity, golf: Secondary | ICD-10-CM | POA: Insufficient documentation

## 2016-01-20 DIAGNOSIS — X58XXXA Exposure to other specified factors, initial encounter: Secondary | ICD-10-CM | POA: Insufficient documentation

## 2016-01-20 DIAGNOSIS — Y929 Unspecified place or not applicable: Secondary | ICD-10-CM | POA: Insufficient documentation

## 2016-01-20 DIAGNOSIS — F17210 Nicotine dependence, cigarettes, uncomplicated: Secondary | ICD-10-CM | POA: Insufficient documentation

## 2016-01-20 DIAGNOSIS — Y998 Other external cause status: Secondary | ICD-10-CM | POA: Insufficient documentation

## 2016-01-20 DIAGNOSIS — S8391XA Sprain of unspecified site of right knee, initial encounter: Secondary | ICD-10-CM

## 2016-01-20 MED ORDER — NAPROXEN 500 MG PO TABS: 500.0000 mg | ORAL_TABLET | Freq: Two times a day (BID) | ORAL | Status: DC

## 2016-01-20 NOTE — ED Provider Notes (Signed)
[facility] Emergency Department Provider Note  ____________________________________________  Time seen: Approximately 9:11 AM  I have reviewed the triage vital signs and the nursing notes.   HISTORY  Chief Complaint Knee Pain    HPI Omar Fernandez is a 40 y.o. male , NAD, presents to the emergency department with four-day history of right knee pain. States he was stepping out of a golf cart and felt a pop about his right knee. Did not have any falls, injuries, traumas to the knee. States he went home and felt a swollen area behind the knee. Has been alternating heat and ice as well as has wrapped the knee but continues to have pain. Denies any numbness, weakness, tingling. Has not been able to bear weight about the right lower extremity due to the knee pain. No previous injuries to the knee noted. Denies any redness, swelling, skin sores about the right lower extremity.   Past Medical History  Diagnosis Date  . Degenerative disc disease, lumbar     There are no active problems to display for this patient.   Past Surgical History  Procedure Laterality Date  . Appendectomy    . Joint replacement      Current Outpatient Rx  Name  Route  Sig  Dispense  Refill  . naproxen (NAPROSYN) 500 MG tablet   Oral   Take 1 tablet (500 mg total) by mouth 2 (two) times daily with a meal.   14 tablet   0     Allergies Review of patient's allergies indicates no known allergies.  No family history on file.  Social History Social History  Substance Use Topics  . Smoking status: Current Every Day Smoker -- 0.50 packs/day    Types: Cigarettes  . Smokeless tobacco: None  . Alcohol Use: No     Review of Systems  Constitutional: No fever/chills Eyes: No visual changes.  Cardiovascular: No chest pain. Respiratory: No cough. No shortness of breath. No wheezing.  Gastrointestinal: No abdominal pain.  No nausea, vomiting.  Musculoskeletal: Positive right  knee pain. Negative right hip, ankle, foot, lower extremity pain Skin: Positive swelling about the back of right knee. Negative for rash, redness and soreness, warmth. Neurological: Negative for headaches, focal weakness or numbness. No tingling 10-point ROS otherwise negative.  ____________________________________________   PHYSICAL EXAM:  VITAL SIGNS: ED Triage Vitals  Enc Vitals Group     BP 01/20/16 0827 128/87 mmHg     Pulse Rate 01/20/16 0827 69     Resp 01/20/16 0827 18     Temp 01/20/16 0827 98 F (36.7 C)     Temp Source 01/20/16 0827 Oral     SpO2 01/20/16 0827 100 %     Weight 01/20/16 0823 195 lb (88.451 kg)     Height 01/20/16 0823 5\' 11"  (1.803 m)     Head Cir --      Peak Flow --      Pain Score 01/20/16 0823 6     Pain Loc --      Pain Edu? --      Excl. in GC? --      Constitutional: Alert and oriented. Well appearing and in no acute distress. Eyes: Conjunctivae are normal.  Head: Atraumatic. Cardiovascular:  Good peripheral circulation with 2+ pulses noted about the right lower extremity. Respiratory: Normal respiratory effort without tachypnea or retractions.  Musculoskeletal: Gait within mild limp favoring the right leg. Tenderness to palpation about the posterior right knee and  posterior proximal calf without any palpated swelling or masses. Tenderness to palpation about the area of the PCL. Patient has full range of motion of the right knee, ankle, foot but with pain with full extension about the right knee. No laxity with anterior posterior drawer. No laxity with varus or valgus stress. No lower extremity tenderness nor edema.  No joint effusions. Neurologic:  Normal speech and language. No gross focal neurologic deficits are appreciated.  Skin:  Skin is warm, dry and intact. No rash, redness, swelling, skin sores noted. Psychiatric: Mood and affect are normal. Speech and behavior are normal. Patient exhibits appropriate insight and  judgement.   ____________________________________________   LABS  None ____________________________________________  EKG  None ____________________________________________  RADIOLOGY I have personally viewed and evaluated these images (plain radiographs) as part of my medical decision making, as well as reviewing the written report by the radiologist.  Dg Knee Complete 4 Views Right  01/20/2016  CLINICAL DATA:  Felt a pop behind RIGHT knee this past weekend while playing golf, noticed swelling on Sunday, minimal relief with heat and Aleve, unable to bear full weight, limping, pain and swelling for 4 days, initial encounter EXAM: RIGHT KNEE - COMPLETE 4+ VIEW COMPARISON:  None ; prior exam on the time line from 07/14/1997 predates PACs and is not available for comparison FINDINGS: Osseous mineralization normal for technique. Joint spaces preserved. No acute fracture, dislocation, or bone destruction. Small knee joint effusion. Mild anterior soft tissue swelling. IMPRESSION: No acute osseous abnormalities. Small RIGHT knee joint effusion. Electronically Signed   By: Ulyses Southward M.D.   On: 01/20/2016 09:01    ____________________________________________    PROCEDURES  Procedure(s) performed: None    Medications - No data to display   ____________________________________________   INITIAL IMPRESSION / ASSESSMENT AND PLAN / ED COURSE  Pertinent imaging results that were available during my care of the patient were reviewed by me and considered in my medical decision making (see chart for details).  Patient's diagnosis is consistent with right knee sprain. Patient was placed in Ace wrap and given crutches for support. Patient will be discharged home with prescriptions for naproxen to take as directed. May also take Tylenol over-the-counter as needed for additional pain control. Advise patient to continue to alternate heat and ice. Patient is to follow up with Dr. Hyacinth Meeker in  orthopedics if symptoms persist past this treatment course. Patient is given ED precautions to return to the ED for any worsening or new symptoms.    ____________________________________________  FINAL CLINICAL IMPRESSION(S) / ED DIAGNOSES  Final diagnoses:  Right knee sprain, initial encounter      NEW MEDICATIONS STARTED DURING THIS VISIT:  Discharge Medication List as of 01/20/2016  9:21 AM    START taking these medications   Details  naproxen (NAPROSYN) 500 MG tablet Take 1 tablet (500 mg total) by mouth 2 (two) times daily with a meal., Starting 01/20/2016, Until Discontinued, Print             Hope Pigeon, PA-C 01/20/16 0945  Jene Every, MD 01/20/16 1600

## 2016-01-20 NOTE — ED Notes (Signed)
Pt reports Sunday he felt a "pop" to his rt knee, since then he has noticed swelling and continued pain.

## 2016-01-20 NOTE — Discharge Instructions (Signed)
Knee Sprain A knee sprain is a tear in one of the strong, fibrous tissues that connect the bones (ligaments) in your knee. The severity of the sprain depends on how much of the ligament is torn. The tear can be either partial or complete. CAUSES  Often, sprains are a result of a fall or injury. The force of the impact causes the fibers of your ligament to stretch too much. This excess tension causes the fibers of your ligament to tear. SIGNS AND SYMPTOMS  You may have some loss of motion in your knee. Other symptoms include:  Bruising.  Pain in the knee area.  Tenderness of the knee to the touch.  Swelling. DIAGNOSIS  To diagnose a knee sprain, your health care provider will physically examine your knee. Your health care provider may also suggest an X-ray exam of your knee to make sure no bones are broken. TREATMENT  If your ligament is only partially torn, treatment usually involves keeping the knee in a fixed position (immobilization) or bracing your knee for activities that require movement for several weeks. To do this, your health care provider will apply a bandage, cast, or splint to keep your knee from moving and to support your knee during movement until it heals. For a partially torn ligament, the healing process usually takes 4-6 weeks. If your ligament is completely torn, depending on which ligament it is, you may need surgery to reconnect the ligament to the bone or reconstruct it. After surgery, a cast or splint may be applied and will need to stay on your knee for 4-6 weeks while your ligament heals. HOME CARE INSTRUCTIONS  Keep your injured knee elevated to decrease swelling.  To ease pain and swelling, apply ice to the injured area:  Put ice in a plastic bag.  Place a towel between your skin and the bag.  Leave the ice on for 20 minutes, 2-3 times a day.  Only take medicine for pain as directed by your health care provider.  Do not leave your knee unprotected until  pain and stiffness go away (usually 4-6 weeks).  If you have a cast or splint, do not allow it to get wet. If you have been instructed not to remove it, cover it with a plastic bag when you shower or bathe. Do not swim.  Your health care provider may suggest exercises for you to do during your recovery to prevent or limit permanent weakness and stiffness. SEEK IMMEDIATE MEDICAL CARE IF:  Your cast or splint becomes damaged.  Your pain becomes worse.  You have significant pain, swelling, or numbness below the cast or splint. MAKE SURE YOU:  Understand these instructions.  Will watch your condition.  Will get help right away if you are not doing well or get worse.   This information is not intended to replace advice given to you by your health care provider. Make sure you discuss any questions you have with your health care provider.   Document Released: 07/24/2005 Document Revised: 08/14/2014 Document Reviewed: 03/05/2013 Elsevier Interactive Patient Education 2016 [employer] Inc[employer]   Adult nurselastic Bandage and RICE  WHAT DOES AN ELASTIC BANDAGE DO?    Elastic bandages come in different shapes and sizes. They generally provide support to your injury and reduce swelling while you are healing, but they can perform different functions. Your health care provider will help you to decide what is best for your protection, recovery, or rehabilitation following an injury.  WHAT ARE SOME GENERAL TIPS FOR  USING AN ELASTIC BANDAGE?  Use the bandage as directed by the maker of the bandage that you are using.  Do not wrap the bandage too tightly. This may cut off the circulation in the arm or leg in the area below the bandage.  If part of your body beyond the bandage becomes blue, numb, cold, swollen, or is more painful, your bandage is most likely too tight. If this occurs, remove your bandage and reapply it more loosely. See your health care provider if the bandage seems to be making your problems worse  rather than better.  An elastic bandage should be removed and reapplied every 3-4 hours or as directed by your health care provider. WHAT IS RICE?  The routine care of many injuries includes rest, ice, compression, and elevation (RICE therapy).  Rest  Rest is required to allow your body to heal. Generally, you can resume your routine activities when you are comfortable and have been given permission by your health care provider.  Ice  Icing your injury helps to keep the swelling down and it reduces pain. Do not apply ice directly to your skin.  Put ice in a plastic bag.  Place a towel between your skin and the bag.  Leave the ice on for 20 minutes, 2-3 times per day. Do this for as long as you are directed by your health care provider.  Compression  Compression helps to keep swelling down, gives support, and helps with discomfort. Compression may be done with an elastic bandage.  Elevation  Elevation helps to reduce swelling and it decreases pain. If possible, your injured area should be placed at or above the level of your heart or the center of your chest.  WHEN SHOULD I SEEK MEDICAL CARE?  You should seek medical care if:  You have persistent pain and swelling.  Your symptoms are getting worse rather than improving. These symptoms may indicate that further evaluation or further X-rays are needed. Sometimes, X-rays may not show a small broken bone (fracture) until a number of days later. Make a follow-up appointment with your health care provider. Ask when your X-ray results will be ready. Make sure that you get your X-ray results.  WHEN SHOULD I SEEK IMMEDIATE MEDICAL CARE?  You should seek immediate medical care if:  You have a sudden onset of severe pain at or below the area of your injury.  You develop redness or increased swelling around your injury.  You have tingling or numbness at or below the area of your injury that does not improve after you remove the elastic bandage. This  information is not intended to replace advice given to you by your health care provider. Make sure you discuss any questions you have with your health care provider.  Document Released: 01/13/2002 Document Revised: 04/14/2015 Document Reviewed: 03/09/2014  Elsevier Interactive Patient Education [employer]

## 2016-01-20 NOTE — ED Notes (Signed)
States he felt a "pop" behind right knee this past weekend while playing golf  States he did not fall..noticed a swollen area behind knee on Sunday  Used aleve and heat to area with min relief  Unable to bear full wt   Ambulates with limp

## 2016-10-30 ENCOUNTER — Emergency Department: Payer: Self-pay

## 2016-10-30 ENCOUNTER — Emergency Department
Admission: EM | Admit: 2016-10-30 | Discharge: 2016-10-30 | Disposition: A | Discharge status: Home / Self Care | Payer: Self-pay | Attending: Emergency Medicine | Admitting: Emergency Medicine

## 2016-10-30 ENCOUNTER — Encounter: Payer: Self-pay | Admitting: *Deleted

## 2016-10-30 DIAGNOSIS — F17210 Nicotine dependence, cigarettes, uncomplicated: Secondary | ICD-10-CM | POA: Insufficient documentation

## 2016-10-30 DIAGNOSIS — M5442 Lumbago with sciatica, left side: Secondary | ICD-10-CM | POA: Insufficient documentation

## 2016-10-30 DIAGNOSIS — M5432 Sciatica, left side: Secondary | ICD-10-CM

## 2016-10-30 MED ORDER — PREDNISONE 10 MG (21) PO TBPK: ORAL_TABLET | ORAL | 0 refills | Status: DC

## 2016-10-30 MED ORDER — METHYLPREDNISOLONE SODIUM SUCC 125 MG IJ SOLR
125.0000 mg | Freq: Once | INTRAMUSCULAR | Status: AC
  Administered 2016-10-30: 125 mg via INTRAMUSCULAR
  Filled 2016-10-30: qty 2

## 2016-10-30 MED ORDER — KETOROLAC TROMETHAMINE 60 MG/2ML IM SOLN
30.0000 mg | Freq: Once | INTRAMUSCULAR | Status: AC
  Administered 2016-10-30: 30 mg via INTRAMUSCULAR
  Filled 2016-10-30: qty 2

## 2016-10-30 NOTE — ED Notes (Addendum)
See triage note  Having left lower back pain and left leg pain  Ambulates with limp  Area is tender on palpation

## 2016-10-30 NOTE — ED Provider Notes (Signed)
[facility] Emergency Department Provider Note  ____________________________________________  Time seen: Approximately 2:56 PM  I have reviewed the triage vital signs and the nursing notes.   HISTORY  Chief Complaint Hip Pain and Back Pain    HPI Omar Fernandez is a 41 y.o. male that presents to the emergency department with chronic low back pain that has changed in character. New pain has been present for one week. Patient states that he knows that he has sciatica. He has a couple slipped disks and degenerative disc disease. Patient states that the pain begins in his lower right side and radiates down the back of his right leg.Patient is having some numbness in his lower leg and feet. No tingling. Patient denies fever, headache, shortness of breath, chest pain, nausea, vomiting, abdominal pain, dysuria, bowel or bladder dysfunction, saddle paresthesias.   Past Medical History:  Diagnosis Date  . Degenerative disc disease, lumbar     There are no active problems to display for this patient.   Past Surgical History:  Procedure Laterality Date  . APPENDECTOMY    . JOINT REPLACEMENT      Prior to Admission medications   Medication Sig Start Date End Date Taking? Authorizing Provider  naproxen (NAPROSYN) 500 MG tablet Take 1 tablet (500 mg total) by mouth 2 (two) times daily with a meal. 01/20/16   Jami L Hagler, PA-C  predniSONE (STERAPRED UNI-PAK 21 TAB) 10 MG (21) TBPK tablet Take 6 tablets on day 1, take 5 tablets on day 2, take 4 tablets on day 3, take 3 tablets on day 4, take 2 tablets on day 5, take 1 tablet on day 6 10/30/16   Enid Derry, PA-C    Allergies Patient has no known allergies.  History reviewed. No pertinent family history.  Social History Social History  Substance Use Topics  . Smoking status: Current Every Day Smoker    Packs/day: 0.50    Types: Cigarettes  . Smokeless tobacco: Not on file  . Alcohol use No     Review  of Systems  Constitutional: No fever/chills ENT: No upper respiratory complaints. Cardiovascular: No chest pain. Respiratory: No SOB. Gastrointestinal: No abdominal pain.  No nausea, no vomiting.  Musculoskeletal: Positive for back pain. Skin: Negative for rash, abrasions, lacerations, ecchymosis. Neurological: Negative for headaches or tingling   ____________________________________________   PHYSICAL EXAM:  VITAL SIGNS: ED Triage Vitals  Enc Vitals Group     BP 10/30/16 1302 123/89     Pulse Rate 10/30/16 1302 93     Resp 10/30/16 1302 18     Temp 10/30/16 1302 98.4 F (36.9 C)     Temp Source 10/30/16 1302 Oral     SpO2 10/30/16 1302 98 %     Weight 10/30/16 1302 200 lb (90.7 kg)     Height 10/30/16 1302 6' (1.829 m)     Head Circumference --      Peak Flow --      Pain Score 10/30/16 1301 10     Pain Loc --      Pain Edu? --      Excl. in GC? --      Constitutional: Alert and oriented. Well appearing and in no acute distress. Eyes: Conjunctivae are normal. PERRL. EOMI. Head: Atraumatic. ENT:      Ears:      Nose: No congestion/rhinnorhea.      Mouth/Throat: Mucous membranes are moist.  Neck: No stridor.  Cardiovascular: Normal rate, regular rhythm.  Good peripheral circulation. Respiratory: Normal respiratory effort without tachypnea or retractions. Lungs CTAB. Good air entry to the bases with no decreased or absent breath sounds. Musculoskeletal: Full range of motion to all extremities. No gross deformities appreciated. Tender to palpation in the lumbar spine. Tenderness over right SI joint. Positive straight leg raise. Negative cross leg raise and Faber test. Neurologic:  Normal speech and language. No gross focal neurologic deficits are appreciated.  Skin:  Skin is warm, dry and intact. No rash noted.   ____________________________________________   LABS (all labs ordered are listed, but only abnormal results are displayed)  Labs Reviewed - No data to  display ____________________________________________  EKG   ____________________________________________  RADIOLOGY Lexine Baton, personally viewed and evaluated these images (plain radiographs) as part of my medical decision making, as well as reviewing the written report by the radiologist.  Dg Lumbar Spine 2-3 Views  Result Date: 10/30/2016 CLINICAL DATA:  Chronic progressive low back pain and left hip pain. EXAM: LUMBAR SPINE - 2-3 VIEW COMPARISON:  Radiographs dated 02/11/2015 FINDINGS: There are bilateral pars defects at L5 with grade 1 spondylolisthesis. There is a slight rotoscoliosis of the lumbar spine. Five typical lumbar segments. Slight endplate osteophyte formation at L1-2. No disc space narrowing. IMPRESSION: Bilateral spondylolysis with grade 1 spondylolisthesis at L5-S1. No significant change since 02/11/2015. Electronically Signed   By: Francene Boyers M.D.   On: 10/30/2016 14:02    ____________________________________________    PROCEDURES  Procedure(s) performed:    Procedures    Medications  methylPREDNISolone sodium succinate (SOLU-MEDROL) 125 mg/2 mL injection 125 mg (not administered)  ketorolac (TORADOL) injection 30 mg (30 mg Intramuscular Given 10/30/16 1401)     ____________________________________________   INITIAL IMPRESSION / ASSESSMENT AND PLAN / ED COURSE  Pertinent labs & imaging results that were available during my care of the patient were reviewed by me and considered in my medical decision making (see chart for details).  Review of the [place] CSRS was performed in accordance of the [facility] prior to dispensing any controlled drugs.     Patient's diagnosis is consistent with Sciatica. Vital signs and exam are reassuring. X-ray negative for acute processes. All findings were disclosed to patient. Patient states that he is having some numbness in his lower right leg but winces when I touch his lower leg. While wincing, he states that he can  barely feel it. No bowel or bladder dysfunction. Patient was given Toradol and Solu-Medrol in ED. Patient will be discharged home with prescriptions for prednisone. Patient is to follow up with PCP as directed. Patient is given ED precautions to return to the ED for any worsening or new symptoms.     ____________________________________________  FINAL CLINICAL IMPRESSION(S) / ED DIAGNOSES  Final diagnoses:  Sciatica of left side      NEW MEDICATIONS STARTED DURING THIS VISIT:  New Prescriptions   PREDNISONE (STERAPRED UNI-PAK 21 TAB) 10 MG (21) TBPK TABLET    Take 6 tablets on day 1, take 5 tablets on day 2, take 4 tablets on day 3, take 3 tablets on day 4, take 2 tablets on day 5, take 1 tablet on day 6        This chart was dictated using voice recognition software/Dragon. Despite best efforts to proofread, errors can occur which can change the meaning. Any change was purely unintentional.    Enid Derry, PA-C 10/30/16 1615    Emily Filbert, MD 10/31/16 [phone]

## 2016-10-30 NOTE — ED Triage Notes (Signed)
States left hip and back pain, states hx of chronic pain for several years but states increased pain, ambulatory

## 2017-01-29 ENCOUNTER — Emergency Department
Admission: EM | Admit: 2017-01-29 | Discharge: 2017-01-29 | Disposition: A | Discharge status: Home / Self Care | Payer: Self-pay | Attending: Emergency Medicine | Admitting: Emergency Medicine

## 2017-01-29 DIAGNOSIS — Z0289 Encounter for other administrative examinations: Secondary | ICD-10-CM | POA: Insufficient documentation

## 2017-01-29 DIAGNOSIS — M5430 Sciatica, unspecified side: Secondary | ICD-10-CM | POA: Insufficient documentation

## 2017-01-29 DIAGNOSIS — F17210 Nicotine dependence, cigarettes, uncomplicated: Secondary | ICD-10-CM | POA: Insufficient documentation

## 2017-01-29 NOTE — ED Notes (Signed)
See triage note  States he was seen and treated for sciatica at the beginning of the month  Was placed on steroids and had some PT  Now pain free. His employer needs a note for him go back to work

## 2017-01-29 NOTE — ED Provider Notes (Signed)
[facility] Emergency Department Provider Note   ____________________________________________   I have reviewed the triage vital signs and the nursing notes.   HISTORY  Chief Complaint Back Pain    HPI Omar Fernandez is a 41 y.o. male presented to the emergency department requesting a note to return to work. Patient was originally seen on 3/25 for left sided sciatica symptoms. When he attempted to return to work his employer advised he needed a note to return to full duties. Patient reported that he is symptom-free today and able to perform all daily activities and work activities without sciatica symptoms. Patient denies fever, chills, headache, vision changes, chest pain, chest tightness, shortness of breath, abdominal pain, nausea and vomiting.  Past Medical History:  Diagnosis Date  . Degenerative disc disease, lumbar     There are no active problems to display for this patient.   Past Surgical History:  Procedure Laterality Date  . APPENDECTOMY    . JOINT REPLACEMENT      Prior to Admission medications   Medication Sig Start Date End Date Taking? Authorizing Provider  naproxen (NAPROSYN) 500 MG tablet Take 1 tablet (500 mg total) by mouth 2 (two) times daily with a meal. 01/20/16   Hagler, Jami L, PA-C  predniSONE (STERAPRED UNI-PAK 21 TAB) 10 MG (21) TBPK tablet Take 6 tablets on day 1, take 5 tablets on day 2, take 4 tablets on day 3, take 3 tablets on day 4, take 2 tablets on day 5, take 1 tablet on day 6 10/30/16   Enid Derry, PA-C    Allergies Patient has no known allergies.  No family history on file.  Social History Social History  Substance Use Topics  . Smoking status: Current Every Day Smoker    Packs/day: 0.50    Types: Cigarettes  . Smokeless tobacco: Not on file  . Alcohol use No    Review of Systems Constitutional: Negative for fever/chills Eyes: No visual changes. Cardiovascular: Denies chest  pain. Respiratory: Denies cough Denies shortness of breath. Musculoskeletal: Negative for back pain. Negative left sided sciatica symptoms. Skin: Negative for rash. Neurological:  Negative focal weakness or numbness. Able to ambulate. ____________________________________________   PHYSICAL EXAM:  VITAL SIGNS: ED Triage Vitals  Enc Vitals Group     BP 01/29/17 1316 119/90     Pulse Rate 01/29/17 1316 100     Resp 01/29/17 1316 20     Temp 01/29/17 1316 99.4 F (37.4 C)     Temp Source 01/29/17 1316 Oral     SpO2 01/29/17 1316 97 %     Weight 01/29/17 1317 200 lb (90.7 kg)     Height 01/29/17 1317 6' (1.829 m)     Head Circumference --      Peak Flow --      Pain Score --      Pain Loc --      Pain Edu? --      Excl. in GC? --     Constitutional: Alert and oriented. Well appearing and in no acute distress.  Head: Normocephalic and atraumatic. Eyes: Conjunctivae are normal. PERRL.  Cardiovascular: Normal rate, regular rhythm. Normal distal pulses. Respiratory: Normal respiratory effort. Musculoskeletal: Nontender with normal range of motion in all extremities. Negative lumbar back pain, full spinal range of motion all planes, negative radiculopathy negative sciatica symptoms. Neurologic: Normal speech and language.  Skin:  Skin is warm, dry and intact. No rash noted. Psychiatric: Mood and affect are  normal.  ____________________________________________   LABS (all labs ordered are listed, but only abnormal results are displayed)  Labs Reviewed - No data to display ____________________________________________  EKG None ____________________________________________  RADIOLOGY None ____________________________________________   PROCEDURES  Procedure(s) performed: no    Critical Care performed: no ____________________________________________   INITIAL IMPRESSION / ASSESSMENT AND PLAN / ED COURSE  Pertinent labs & imaging results that were available during  my care of the patient were reviewed by me and considered in my medical decision making (see chart for details).   Patient presented to the emergency department stating he needed a work note to return to work and full duties. Patient was originally seen on 3/25 for left-sided sciatic symptoms. Today patient is pain-free offers no physical complaints. History, physical exam findings and vital signs are reassuring. Patient provided a note to return to work in his discharge paperwork. Patient advised to follow-up with his PCP as needed and return to the emergency department if he noted any return of her original symptoms. Patient informed of clinical course, understand medical decision-making process, and agree with plan.   ____________________________________________   FINAL CLINICAL IMPRESSION(S) / ED DIAGNOSES  Final diagnoses:  Sciatica, unspecified laterality       NEW MEDICATIONS STARTED DURING THIS VISIT:  Discharge Medication List as of 01/29/2017  1:59 PM       Note:  This document was prepared using Dragon voice recognition software and may include unintentional dictation errors.    Clois ComberLittle, Platon Arocho M, PA-C 01/29/17 1433    Merrily Brittleifenbark, Neil, MD 01/29/17 1558

## 2017-01-29 NOTE — ED Triage Notes (Signed)
States was seen here for back pain. States was diagnosed with sciatica. States is now pain free and wishes to return to work. States that his employer required him to come to get a note he can return to work.

## 2017-01-29 NOTE — Discharge Instructions (Signed)
Patient seen in emergency department today for work note. Patient had no physical complaints.

## 2018-01-25 ENCOUNTER — Emergency Department
Admission: EM | Admit: 2018-01-25 | Discharge: 2018-01-25 | Disposition: A | Discharge status: Home / Self Care | Payer: Self-pay | Attending: Emergency Medicine | Admitting: Emergency Medicine

## 2018-01-25 ENCOUNTER — Encounter: Payer: Self-pay | Admitting: Emergency Medicine

## 2018-01-25 DIAGNOSIS — Y998 Other external cause status: Secondary | ICD-10-CM | POA: Insufficient documentation

## 2018-01-25 DIAGNOSIS — Z79899 Other long term (current) drug therapy: Secondary | ICD-10-CM | POA: Insufficient documentation

## 2018-01-25 DIAGNOSIS — F17210 Nicotine dependence, cigarettes, uncomplicated: Secondary | ICD-10-CM | POA: Insufficient documentation

## 2018-01-25 DIAGNOSIS — Y9389 Activity, other specified: Secondary | ICD-10-CM | POA: Insufficient documentation

## 2018-01-25 DIAGNOSIS — S39012A Strain of muscle, fascia and tendon of lower back, initial encounter: Secondary | ICD-10-CM | POA: Insufficient documentation

## 2018-01-25 DIAGNOSIS — X500XXA Overexertion from strenuous movement or load, initial encounter: Secondary | ICD-10-CM | POA: Insufficient documentation

## 2018-01-25 DIAGNOSIS — Y929 Unspecified place or not applicable: Secondary | ICD-10-CM | POA: Insufficient documentation

## 2018-01-25 MED ORDER — TRAMADOL HCL 50 MG PO TABS: 50.0000 mg | ORAL_TABLET | Freq: Two times a day (BID) | ORAL | 0 refills | Status: DC | PRN

## 2018-01-25 MED ORDER — IBUPROFEN 600 MG PO TABS: 600.0000 mg | ORAL_TABLET | Freq: Three times a day (TID) | ORAL | 0 refills | Status: DC | PRN

## 2018-01-25 MED ORDER — TRAMADOL HCL 50 MG PO TABS
50.0000 mg | ORAL_TABLET | Freq: Once | ORAL | Status: AC
  Administered 2018-01-25: 50 mg via ORAL
  Filled 2018-01-25: qty 1

## 2018-01-25 MED ORDER — CYCLOBENZAPRINE HCL 10 MG PO TABS: 10.0000 mg | ORAL_TABLET | Freq: Three times a day (TID) | ORAL | 0 refills | Status: DC | PRN

## 2018-01-25 MED ORDER — CYCLOBENZAPRINE HCL 10 MG PO TABS
10.0000 mg | ORAL_TABLET | Freq: Once | ORAL | Status: AC
  Administered 2018-01-25: 10 mg via ORAL
  Filled 2018-01-25: qty 1

## 2018-01-25 MED ORDER — IBUPROFEN 600 MG PO TABS
600.0000 mg | ORAL_TABLET | Freq: Once | ORAL | Status: AC
  Administered 2018-01-25: 600 mg via ORAL
  Filled 2018-01-25: qty 1

## 2018-01-25 NOTE — ED Provider Notes (Signed)
[facility] Emergency Department Provider Note   ____________________________________________   First MD Initiated Contact with Patient 01/25/18 1018     (approximate)  I have reviewed the triage vital signs and the nursing notes.   HISTORY  Chief Complaint Back Pain    HPI Omar LeveringDanny L Withey is a 42 y.o. male patient complain right upper back pain secondary to change in tiredness yesterday.  Patient awoke this morning for a "crick" in the back.  Patient denies radicular component to his back pain.  Patient has bladder bowel dysfunction.  Patient has a history of lumbar degenerative disc disease.  Patient rates pain as a 6/10.  Patient described the pain as "achy/spasmatic".  No palliative measures for complaint.  Past Medical History:  Diagnosis Date  . Degenerative disc disease, lumbar     There are no active problems to display for this patient.   Past Surgical History:  Procedure Laterality Date  . APPENDECTOMY    . JOINT REPLACEMENT      Prior to Admission medications   Medication Sig Start Date End Date Taking? Authorizing Provider  cyclobenzaprine (FLEXERIL) 10 MG tablet Take 1 tablet (10 mg total) by mouth 3 (three) times daily as needed. 01/25/18   Joni ReiningSmith, Nasiir Monts K, PA-C  ibuprofen (ADVIL,MOTRIN) 600 MG tablet Take 1 tablet (600 mg total) by mouth every 8 (eight) hours as needed. 01/25/18   Joni ReiningSmith, Dalani Mette K, PA-C  naproxen (NAPROSYN) 500 MG tablet Take 1 tablet (500 mg total) by mouth 2 (two) times daily with a meal. 01/20/16   Hagler, Jami L, PA-C  predniSONE (STERAPRED UNI-PAK 21 TAB) 10 MG (21) TBPK tablet Take 6 tablets on day 1, take 5 tablets on day 2, take 4 tablets on day 3, take 3 tablets on day 4, take 2 tablets on day 5, take 1 tablet on day 6 10/30/16   Enid DerryWagner, Ashley, PA-C  traMADol (ULTRAM) 50 MG tablet Take 1 tablet (50 mg total) by mouth every 12 (twelve) hours as needed. 01/25/18   Joni ReiningSmith, Shaely Gadberry K, PA-C    Allergies Patient has  no known allergies.  No family history on file.  Social History Social History   Tobacco Use  . Smoking status: Current Every Day Smoker    Packs/day: 0.50    Types: Cigarettes  . Smokeless tobacco: Never Used  Substance Use Topics  . Alcohol use: No  . Drug use: No    Review of Systems Constitutional: No fever/chills Eyes: No visual changes. ENT: No sore throat. Cardiovascular: Denies chest pain. Respiratory: Denies shortness of breath. Gastrointestinal: No abdominal pain.  No nausea, no vomiting.  No diarrhea.  No constipation. Genitourinary: Negative for dysuria. Musculoskeletal: Positive for back pain. Skin: Negative for rash. Neurological: Negative for headaches, focal weakness or numbness.  ____________________________________________   PHYSICAL EXAM:  VITAL SIGNS: ED Triage Vitals  Enc Vitals Group     BP 01/25/18 0955 129/80     Pulse Rate 01/25/18 0955 75     Resp 01/25/18 0955 16     Temp 01/25/18 0955 99.5 F (37.5 C)     Temp Source 01/25/18 0955 Oral     SpO2 01/25/18 0955 98 %     Weight 01/25/18 0956 195 lb (88.5 kg)     Height 01/25/18 0956 5\' 11"  (1.803 m)     Head Circumference --      Peak Flow --      Pain Score 01/25/18 0955 6  Pain Loc --      Pain Edu? --      Excl. in GC? --    Constitutional: Alert and oriented. Well appearing and in no acute distress. Neck: No cervical spine tenderness to palpation. Cardiovascular: Normal rate, regular rhythm. Grossly normal heart sounds.  Good peripheral circulation. Respiratory: Normal respiratory effort.  No retractions. Lungs CTAB. Musculoskeletal: No lower extremity tenderness nor edema.  No joint effusions. Neurologic:  Normal speech and language. No gross focal neurologic deficits are appreciated. No gait instability. Skin:  Skin is warm, dry and intact. No rash noted. Psychiatric: Mood and affect are normal. Speech and behavior are  normal.  ____________________________________________   LABS (all labs ordered are listed, but only abnormal results are displayed)  Labs Reviewed - No data to display ____________________________________________  EKG   ____________________________________________  RADIOLOGY  ED MD interpretation:    Official radiology report(s): No results found.  ____________________________________________   PROCEDURES  Procedure(s) performed: None  Procedures  Critical Care performed: No  ____________________________________________   INITIAL IMPRESSION / ASSESSMENT AND PLAN / ED COURSE  As part of my medical decision making, I reviewed the following data within the electronic MEDICAL RECORD NUMBER    Mid back pain secondary to strain.  Reviewed previous x-ray of lumbar spine showing degenerative changes.  Patient given discharge care instruction advised take medication as directed.  [facility].      ____________________________________________   FINAL CLINICAL IMPRESSION(S) / ED DIAGNOSES  Final diagnoses:  Strain of lumbar region, initial encounter     ED Discharge Orders        Ordered    traMADol (ULTRAM) 50 MG tablet  Every 12 hours PRN     01/25/18 1032    cyclobenzaprine (FLEXERIL) 10 MG tablet  3 times daily PRN     01/25/18 1032    ibuprofen (ADVIL,MOTRIN) 600 MG tablet  Every 8 hours PRN     01/25/18 1032       Note:  This document was prepared using Dragon voice recognition software and may include unintentional dictation errors.    Joni Reining, PA-C 01/25/18 1040    Emily Filbert, MD 01/25/18 [phone]

## 2018-01-25 NOTE — ED Triage Notes (Signed)
Patient presents to the ED with upper back pain after changing tires in his truck yesterday.  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

## 2018-01-25 NOTE — ED Notes (Signed)
See triage note  Presents with pain and muscle tightness to right mid back   States pain developed after changing tire yesterday

## 2018-01-25 NOTE — Discharge Instructions (Addendum)
Follow discharge care instruction take medication as directed.  Advised to consider an elastic lumbar support when performing heavy lifting at work.

## 2018-12-23 ENCOUNTER — Other Ambulatory Visit: Payer: Self-pay

## 2018-12-23 ENCOUNTER — Emergency Department
Admission: EM | Admit: 2018-12-23 | Discharge: 2018-12-23 | Disposition: A | Discharge status: Home / Self Care | Payer: HRSA Program | Attending: Emergency Medicine | Admitting: Emergency Medicine

## 2018-12-23 ENCOUNTER — Emergency Department: Payer: HRSA Program

## 2018-12-23 ENCOUNTER — Ambulatory Visit: Payer: Self-pay | Admitting: *Deleted

## 2018-12-23 DIAGNOSIS — B349 Viral infection, unspecified: Secondary | ICD-10-CM | POA: Diagnosis not present

## 2018-12-23 DIAGNOSIS — F17210 Nicotine dependence, cigarettes, uncomplicated: Secondary | ICD-10-CM | POA: Diagnosis not present

## 2018-12-23 DIAGNOSIS — R0602 Shortness of breath: Secondary | ICD-10-CM | POA: Diagnosis present

## 2018-12-23 DIAGNOSIS — Z20828 Contact with and (suspected) exposure to other viral communicable diseases: Secondary | ICD-10-CM | POA: Diagnosis not present

## 2018-12-23 LAB — CBC WITH DIFFERENTIAL/PLATELET
Abs Immature Granulocytes: 0.04 10*3/uL (ref 0.00–0.07)
Basophils Absolute: 0 10*3/uL (ref 0.0–0.1)
Basophils Relative: 0 %
Eosinophils Absolute: 0.1 10*3/uL (ref 0.0–0.5)
Eosinophils Relative: 1 %
HCT: 47.4 % (ref 39.0–52.0)
Hemoglobin: 16.2 g/dL (ref 13.0–17.0)
Immature Granulocytes: 1 %
Lymphocytes Relative: 24 %
Lymphs Abs: 1.8 10*3/uL (ref 0.7–4.0)
MCH: 30 pg (ref 26.0–34.0)
MCHC: 34.2 g/dL (ref 30.0–36.0)
MCV: 87.8 fL (ref 80.0–100.0)
Monocytes Absolute: 0.4 10*3/uL (ref 0.1–1.0)
Monocytes Relative: 5 %
Neutro Abs: 5.1 10*3/uL (ref 1.7–7.7)
Neutrophils Relative %: 69 %
Platelets: 265 10*3/uL (ref 150–400)
RBC: 5.4 MIL/uL (ref 4.22–5.81)
RDW: 12.7 % (ref 11.5–15.5)
WBC: 7.4 10*3/uL (ref 4.0–10.5)
nRBC: 0 % (ref 0.0–0.2)

## 2018-12-23 LAB — BRAIN NATRIURETIC PEPTIDE: B Natriuretic Peptide: 26 pg/mL (ref 0.0–100.0)

## 2018-12-23 LAB — BASIC METABOLIC PANEL
Anion gap: 6 (ref 5–15)
BUN: 17 mg/dL (ref 6–20)
CO2: 27 mmol/L (ref 22–32)
Calcium: 9.2 mg/dL (ref 8.9–10.3)
Chloride: 109 mmol/L (ref 98–111)
Creatinine, Ser: 0.86 mg/dL (ref 0.61–1.24)
GFR calc Af Amer: >60 mL/min (ref >60)
GFR calc non Af Amer: >60 mL/min (ref >60)
Glucose, Bld: 117 mg/dL — ABNORMAL HIGH (ref 70–99)
Potassium: 4.8 mmol/L (ref 3.5–5.1)
Sodium: 142 mmol/L (ref 135–145)

## 2018-12-23 LAB — SARS CORONAVIRUS 2 BY RT PCR (HOSPITAL ORDER, PERFORMED IN ~~LOC~~ HOSPITAL LAB): SARS Coronavirus 2: NEGATIVE

## 2018-12-23 MED ORDER — HYDROCOD POLST-CPM POLST ER 10-8 MG/5ML PO SUER: 5.0000 mL | Freq: Two times a day (BID) | ORAL | 0 refills | Status: DC

## 2018-12-23 MED ORDER — IBUPROFEN 800 MG PO TABS: 800.0000 mg | ORAL_TABLET | Freq: Three times a day (TID) | ORAL | 0 refills | Status: DC | PRN

## 2018-12-23 MED ORDER — KETOROLAC TROMETHAMINE 30 MG/ML IJ SOLN
30.0000 mg | Freq: Once | INTRAMUSCULAR | Status: AC
  Administered 2018-12-23: 11:00:00 30 mg via INTRAVENOUS
  Filled 2018-12-23: qty 1

## 2018-12-23 MED ORDER — SODIUM CHLORIDE 0.9 % IV SOLN
Freq: Once | INTRAVENOUS | Status: AC
  Administered 2018-12-23: 11:00:00 via INTRAVENOUS

## 2018-12-23 NOTE — Telephone Encounter (Signed)
Pt called to bet testing for the covid-19. he went to work this morning and he has a temp of 100.6 and was sent home until he is tested.  He said that he was around people on Saturday when fishing and started feeling bad on Sunday.  He has a cough, body aches, and some shortness of breath. He denies runny nose, headache (but had one yesterday), loss of smell or taste or diarrhea. Before Saturday, he has been wearing a mask when going out in public. He does not have a provider. After speaking with flow at [facility], he was advised to go to the Ed at the closest hospital for assessment.  Pt voiced understanding.  Reason for Disposition . MILD difficulty breathing (e.g., minimal/no SOB at rest, SOB with walking, pulse <100)  Answer Assessment - Initial Assessment Questions 1. COVID-19 DIAGNOSIS: "Who made your Coronavirus (COVID-19) diagnosis?" "Was it confirmed by a positive lab test?" If not diagnosed by a HCP, ask "Are there lots of cases (community spread) where you live?" (See public health department website, if unsure)   * MAJOR community spread: high number of cases; numbers of cases are increasing; many people hospitalized.   * MINOR community spread: low number of cases; not increasing; few or no people hospitalized     In the community 2. ONSET: "When did the COVID-19 symptoms start?"      Sunday 3. WORST SYMPTOM: "What is your worst symptom?" (e.g., cough, fever, shortness of breath, muscle aches)     Shortness of breath and body aches 4. COUGH: "Do you have a cough?" If so, ask: "How bad is the cough?"       yes 5. FEVER: "Do you have a fever?" If so, ask: "What is your temperature, how was it measured, and when did it start?"     Yes 100.6 6. RESPIRATORY STATUS: "Describe your breathing?" (e.g., shortness of breath, wheezing, unable to speak)      Shortness of breath, can not take a real deep breath 7. BETTER-SAME-WORSE: "Are you getting better, staying the same  or getting worse compared to yesterday?"  If getting worse, ask, "In what way?"     Worst than yesterday 8. HIGH RISK DISEASE: "Do you have any chronic medical problems?" (e.g., asthma, heart or lung disease, weak immune system, etc.)     no 9. PREGNANCY: "Is there any chance you are pregnant?" "When was your last menstrual period?"     n/a 10. OTHER SYMPTOMS: "Do you have any other symptoms?"  (e.g., runny nose, headache, sore throat, loss of smell)       Little headache last night, not this morning  Protocols used: CORONAVIRUS (COVID-19) DIAGNOSED OR SUSPECTED-A-AH

## 2018-12-23 NOTE — ED Triage Notes (Addendum)
Pt reports he started feeling achy and fatigued on Saturday night. Also has symptoms of runny nose, cough, SOB, body aches and temp of 100.6 at work this morning. Pt speaking with this RN with no signs of acute distress. A&Ox 4. Pt took 800mg  ibuprofen, temp 98.7 at this time

## 2018-12-23 NOTE — ED Provider Notes (Signed)
[facility] Emergency Department Provider Note       Time seen: ----------------------------------------- 11:02 AM on 12/23/2018 -----------------------------------------   I have reviewed the triage vital signs and the nursing notes.  HISTORY   Chief Complaint Shortness of Breath   HPI Omar Fernandez is a 43 y.o. male with a history of degenerative disc disease who presents to the ED for flulike symptoms.  Patient reports feeling achy and fatigued on Saturday night.  He was having runny nose, cough, shortness of breath and body aches with a temperature of 100.6 at work today.  He is also had diarrhea.  Past Medical History:  Diagnosis Date  . Degenerative disc disease, lumbar     There are no active problems to display for this patient.   Past Surgical History:  Procedure Laterality Date  . APPENDECTOMY    . JOINT REPLACEMENT      Allergies Patient has no known allergies.  Social History Social History   Tobacco Use  . Smoking status: Current Every Day Smoker    Packs/day: 0.50    Types: Cigarettes  . Smokeless tobacco: Never Used  Substance Use Topics  . Alcohol use: No  . Drug use: No   Review of Systems Constitutional: Positive for fevers, chills, aches Cardiovascular: Negative for chest pain. Respiratory: Positive for shortness of breath Gastrointestinal: Negative for abdominal pain, positive for diarrhea Musculoskeletal: Positive for body aches Skin: Negative for rash. Neurological: Negative for headaches, focal weakness or numbness.  All systems negative/normal/unremarkable except as stated in the HPI  ____________________________________________   PHYSICAL EXAM:  VITAL SIGNS: ED Triage Vitals  Enc Vitals Group     BP 12/23/18 1021 (!) 132/98     Pulse Rate 12/23/18 1021 72     Resp 12/23/18 1021 18     Temp 12/23/18 1021 98.7 F (37.1 C)     Temp Source 12/23/18 1021 Oral     SpO2 12/23/18 1021 100 %      Weight 12/23/18 1022 180 lb (81.6 kg)     Height 12/23/18 1022 5\' 11"  (1.803 m)     Head Circumference --      Peak Flow --      Pain Score 12/23/18 1022 0     Pain Loc --      Pain Edu? --      Excl. in GC? --    Constitutional: Alert and oriented. Well appearing and in no distress. Eyes: Conjunctivae are normal. Normal extraocular movements. ENT      Head: Normocephalic and atraumatic.      Nose: No congestion/rhinnorhea.      Mouth/Throat: Mucous membranes are moist.      Neck: No stridor. Cardiovascular: Normal rate, regular rhythm. No murmurs, rubs, or gallops. Respiratory: Normal respiratory effort without tachypnea nor retractions. Breath sounds are clear and equal bilaterally. No wheezes/rales/rhonchi. Gastrointestinal: Soft and nontender. Normal bowel sounds Musculoskeletal: Nontender with normal range of motion in extremities. No lower extremity tenderness nor edema. Neurologic:  Normal speech and language. No gross focal neurologic deficits are appreciated.  Skin:  Skin is warm, dry and intact. No rash noted. Psychiatric: Mood and affect are normal. Speech and behavior are normal.  ____________________________________________  ED COURSE:  As part of my medical decision making, I reviewed the following data within the electronic MEDICAL RECORD NUMBER History obtained from family if available, nursing notes, old chart and ekg, as well as notes from prior ED visits. Patient presented for flulike symptoms,  we will assess with labs and imaging as indicated at this time.   Procedures  Omar Fernandez was evaluated in Emergency Department on 12/23/2018 for the symptoms described in the history of present illness. He was evaluated in the context of the global COVID-19 pandemic, which necessitated consideration that the patient might be at risk for infection with the SARS-CoV-2 virus that causes COVID-19. Institutional protocols and algorithms that pertain to the evaluation of patients  at risk for COVID-19 are in a state of rapid change based on information released by regulatory bodies including the CDC and federal and state organizations. These policies and algorithms were followed during the patient's care in the ED.  ____________________________________________   LABS (pertinent positives/negatives)  Labs Reviewed  BASIC METABOLIC PANEL - Abnormal; Notable for the following components:      Result Value   Glucose, Bld 117 (*)    All other components within normal limits  SARS CORONAVIRUS 2 (HOSPITAL ORDER, PERFORMED IN [facility])  CBC WITH DIFFERENTIAL/PLATELET  BRAIN NATRIURETIC PEPTIDE   EKG: Interpreted by me, normal sinus rhythm with normal axis, normal intervals, no evidence of hypertrophy or acute infarction  RADIOLOGY Images were viewed by me  Chest x-ray Is unremarkable ____________________________________________   DIFFERENTIAL DIAGNOSIS   Coronavirus, pneumonia, influenza, seasonal allergy  FINAL ASSESSMENT AND PLAN  Viral syndrome   Plan: The patient had presented for influenza-like illness. Patient's labs were reassuring. Patient's imaging reveal any acute process.  He be discharged with anti-inflammatory and cough medications.  He is cleared for outpatient follow-up.   Omar DashJohnathan E Jerimey Burridge, MD    Note: This note was generated in part or whole with voice recognition software. Voice recognition is usually quite accurate but there are transcription errors that can and very often do occur. I apologize for any typographical errors that were not detected and corrected.     Omar Fernandez, Ashby Leflore E, MD 12/23/18 1240

## 2019-01-22 ENCOUNTER — Emergency Department: Payer: Self-pay

## 2019-01-22 ENCOUNTER — Other Ambulatory Visit: Payer: Self-pay

## 2019-01-22 ENCOUNTER — Emergency Department
Admission: EM | Admit: 2019-01-22 | Discharge: 2019-01-22 | Disposition: A | Discharge status: Home / Self Care | Payer: Self-pay | Attending: Emergency Medicine | Admitting: Emergency Medicine

## 2019-01-22 DIAGNOSIS — R519 Headache, unspecified: Secondary | ICD-10-CM

## 2019-01-22 DIAGNOSIS — F17210 Nicotine dependence, cigarettes, uncomplicated: Secondary | ICD-10-CM | POA: Insufficient documentation

## 2019-01-22 DIAGNOSIS — R51 Headache: Secondary | ICD-10-CM | POA: Insufficient documentation

## 2019-01-22 LAB — CBC WITH DIFFERENTIAL/PLATELET
Abs Immature Granulocytes: 0.03 10*3/uL (ref 0.00–0.07)
Basophils Absolute: 0 10*3/uL (ref 0.0–0.1)
Basophils Relative: 0 %
Eosinophils Absolute: 0 10*3/uL (ref 0.0–0.5)
Eosinophils Relative: 1 %
HCT: 49.3 % (ref 39.0–52.0)
Hemoglobin: 16.8 g/dL (ref 13.0–17.0)
Immature Granulocytes: 0 %
Lymphocytes Relative: 18 %
Lymphs Abs: 1.5 10*3/uL (ref 0.7–4.0)
MCH: 29.4 pg (ref 26.0–34.0)
MCHC: 34.1 g/dL (ref 30.0–36.0)
MCV: 86.3 fL (ref 80.0–100.0)
Monocytes Absolute: 0.5 10*3/uL (ref 0.1–1.0)
Monocytes Relative: 6 %
Neutro Abs: 6.2 10*3/uL (ref 1.7–7.7)
Neutrophils Relative %: 75 %
Platelets: 229 10*3/uL (ref 150–400)
RBC: 5.71 MIL/uL (ref 4.22–5.81)
RDW: 12.5 % (ref 11.5–15.5)
WBC: 8.4 10*3/uL (ref 4.0–10.5)
nRBC: 0 % (ref 0.0–0.2)

## 2019-01-22 LAB — BASIC METABOLIC PANEL
Anion gap: 11 (ref 5–15)
BUN: 12 mg/dL (ref 6–20)
CO2: 25 mmol/L (ref 22–32)
Calcium: 9.3 mg/dL (ref 8.9–10.3)
Chloride: 102 mmol/L (ref 98–111)
Creatinine, Ser: 0.81 mg/dL (ref 0.61–1.24)
GFR calc Af Amer: >60 mL/min (ref >60)
GFR calc non Af Amer: >60 mL/min (ref >60)
Glucose, Bld: 101 mg/dL — ABNORMAL HIGH (ref 70–99)
Potassium: 4.5 mmol/L (ref 3.5–5.1)
Sodium: 138 mmol/L (ref 135–145)

## 2019-01-22 MED ORDER — PROCHLORPERAZINE EDISYLATE 10 MG/2ML IJ SOLN
10.0000 mg | Freq: Once | INTRAMUSCULAR | Status: AC
  Administered 2019-01-22: 10 mg via INTRAVENOUS
  Filled 2019-01-22: qty 2

## 2019-01-22 MED ORDER — DIPHENHYDRAMINE HCL 50 MG/ML IJ SOLN
12.5000 mg | Freq: Once | INTRAMUSCULAR | Status: AC
  Administered 2019-01-22: 12.5 mg via INTRAVENOUS
  Filled 2019-01-22: qty 1

## 2019-01-22 MED ORDER — SODIUM CHLORIDE 0.9 % IV BOLUS
1000.0000 mL | Freq: Once | INTRAVENOUS | Status: AC
  Administered 2019-01-22: 1000 mL via INTRAVENOUS

## 2019-01-22 NOTE — ED Notes (Addendum)
Patient states he has a family emergency and has to leave prior to getting test results or further imaging. MD at bedside to discuss risks with patient. Patient understanding of risks and states he must leave. Patient given discharge instructions and followup care by MD. IV removed. Patient ambulatory to lobby with steady gait and NAD noted.

## 2019-01-22 NOTE — ED Triage Notes (Addendum)
Pt c/o HA with N/V, photophobia for the past 4 days. Denies hx of migraines. States this is the worst HA he has ever had.

## 2019-01-22 NOTE — ED Notes (Signed)
After further discussion with MD, patient to be discharged AMA but with d/c instructions. Ariel, RN was at bedside with patient when MD discussed him being AMA.

## 2019-01-22 NOTE — Discharge Instructions (Addendum)
You have a headache but would prefer to go home before finishing your work-up here.  We did give you Benadryl, this can make you sleepy, we advise you do not drive after getting Benadryl.  If you worsen anyway, or you change your mind about not being here please return to the emergency room.  Please follow-up closely with the neurologist listed above, if your headache gets worse, you have fever, headache or vomiting or you feel worse in any way, please return.

## 2019-01-22 NOTE — ED Provider Notes (Addendum)
[facility] Emergency Department Provider Note  ____________________________________________   I have reviewed the triage vital signs and the nursing notes. Where available I have reviewed prior notes and, if possible and indicated, outside hospital notes.    HISTORY  Chief Complaint Headache    HPI Omar Fernandez is a 43 y.o. male   kh patient seen and evaluated during the coronavirus epidemic during a time with low staffing patient complains of headache.  Gradual onset over the last 4 days.  He states he is had this headache before.  This is not the worst headache of life.  It is "about 1 however".  He denies any fever chills or stiff neck, he has some photophobia.  His mother and his daughter both have migraines.  He is never been diagnosed.  Usually he takes Tylenol and his headaches get better.  This time, he took Tylenol and it still persist.  He is had no vomiting but he did feel somewhat nauseated.  Is not throbbing.  Just a general ache.  Mostly in the top of his head.  He has had no imaging of his headaches in the past.  States he did have a sinus abscess once years ago but this feels different.  He denies any numbness or weakness, did not start with exertion.  No rash no fever no rhinorrhea no cough no URI symptoms no stiff neck no other complaints    Past Medical History:  Diagnosis Date  . Degenerative disc disease, lumbar     There are no active problems to display for this patient.   Past Surgical History:  Procedure Laterality Date  . APPENDECTOMY    . JOINT REPLACEMENT      Prior to Admission medications   Medication Sig Start Date End Date Taking? Authorizing Provider  chlorpheniramine-HYDROcodone (TUSSIONEX PENNKINETIC ER) 10-8 MG/5ML SUER Take 5 mLs by mouth 2 (two) times daily. 12/23/18   Earleen Newport, MD  cyclobenzaprine (FLEXERIL) 10 MG tablet Take 1 tablet (10 mg total) by mouth 3 (three) times daily as needed. 01/25/18    Sable Feil, PA-C  ibuprofen (ADVIL) 800 MG tablet Take 1 tablet (800 mg total) by mouth every 8 (eight) hours as needed. 12/23/18   Earleen Newport, MD  ibuprofen (ADVIL,MOTRIN) 600 MG tablet Take 1 tablet (600 mg total) by mouth every 8 (eight) hours as needed. 01/25/18   Sable Feil, PA-C  naproxen (NAPROSYN) 500 MG tablet Take 1 tablet (500 mg total) by mouth 2 (two) times daily with a meal. 01/20/16   Hagler, Jami L, PA-C  predniSONE (STERAPRED UNI-PAK 21 TAB) 10 MG (21) TBPK tablet Take 6 tablets on day 1, take 5 tablets on day 2, take 4 tablets on day 3, take 3 tablets on day 4, take 2 tablets on day 5, take 1 tablet on day 6 10/30/16   Laban Emperor, PA-C  traMADol (ULTRAM) 50 MG tablet Take 1 tablet (50 mg total) by mouth every 12 (twelve) hours as needed. 01/25/18   Sable Feil, PA-C    Allergies Patient has no known allergies.  No family history on file.  Social History Social History   Tobacco Use  . Smoking status: Current Every Day Smoker    Packs/day: 0.50    Types: Cigarettes  . Smokeless tobacco: Never Used  Substance Use Topics  . Alcohol use: No  . Drug use: No    Review of Systems Constitutional: No fever/chills Eyes: No visual  changes. ENT: No sore throat. No stiff neck no neck pain Cardiovascular: Denies chest pain. Respiratory: Denies shortness of breath. Gastrointestinal:   no vomiting.  No diarrhea.  No constipation. Genitourinary: Negative for dysuria. Musculoskeletal: Negative lower extremity swelling Skin: Negative for rash. Neurological: HPI regarding headaches, focal weakness or numbness.   ____________________________________________   PHYSICAL EXAM:  VITAL SIGNS: ED Triage Vitals  Enc Vitals Group     BP 01/22/19 1053 (!) 126/91     Pulse Rate 01/22/19 1053 81     Resp 01/22/19 1053 16     Temp 01/22/19 1053 98.2 F (36.8 C)     Temp Source 01/22/19 1053 Oral     SpO2 01/22/19 1053 99 %     Weight 01/22/19 1050 180 lb  (81.6 kg)     Height 01/22/19 1050 5\' 11"  (1.803 m)     Head Circumference --      Peak Flow --      Pain Score 01/22/19 1050 10     Pain Loc --      Pain Edu? --      Excl. in GC? --     Constitutional: Alert and oriented. Well appearing and in no acute distress. Eyes: Conjunctivae are normal Head: Atraumatic HEENT: No congestion/rhinnorhea. Mucous membranes are moist.  Oropharynx non-erythematous Neck:   Nontender with no meningismus, no masses, no stridor Cardiovascular: Normal rate, regular rhythm. Grossly normal heart sounds.  Good peripheral circulation. Respiratory: Normal respiratory effort.  No retractions. Lungs CTAB. Abdominal: Soft and nontender. No distention. No guarding no rebound Back:  There is no focal tenderness or step off.  there is no midline tenderness there are no lesions noted. there is no CVA tenderness Musculoskeletal: No lower extremity tenderness, no upper extremity tenderness. No joint effusions, no DVT signs strong distal pulses no edema Neurologic: Cranial nerves II through XII are grossly intact 5 out of 5 strength bilateral upper and lower extremity. Finger to nose within normal limits heel to shin within normal limits, speech is normal with no word finding difficulty or dysarthria, reflexes symmetric, pupils are equally round and reactive to light, there is no pronator drift, sensation is normal, vision is intact to confrontation, gait is deferred, there is no nystagmus, normal neurologic exam Skin:  Skin is warm, dry and intact. No rash noted. Psychiatric: Mood and affect are normal. Speech and behavior are normal.  ____________________________________________   LABS (all labs ordered are listed, but only abnormal results are displayed)  Labs Reviewed  CBC WITH DIFFERENTIAL/PLATELET  BASIC METABOLIC PANEL    Pertinent labs  results that were available during my care of the patient were reviewed by me and considered in my medical decision making  (see chart for details). ____________________________________________  EKG  I personally interpreted any EKGs ordered by me or triage  ____________________________________________  RADIOLOGY  Pertinent labs & imaging results that were available during my care of the patient were reviewed by me and considered in my medical decision making (see chart for details). If possible, patient and/or family made aware of any abnormal findings.  No results found. ____________________________________________    PROCEDURES  Procedure(s) performed: None  Procedures  Critical Care performed: None  ____________________________________________   INITIAL IMPRESSION / ASSESSMENT AND PLAN / ED COURSE  Pertinent labs & imaging results that were available during my care of the patient were reviewed by me and considered in my medical decision making (see chart for details).  Patient with headache, I did obtain  CT scan which is reassuring to my read.  Gradual onset over the last 4 days, does seem to have a history of similar headaches.  Patient will be given pain medication which should be appropriate for his headache, we will obtain CT CTA given his lack of prior imaging, I did talk to about LP at this time he declines.  I do not think is unreasonable to decline.  He is neurologically intact and looks quite well despite headache for 4 days.  ----------------------------------------- 1:30 PM on 01/22/2019 -----------------------------------------  states his headache feels much better he is walking around the room [place] now and now says that he must leave because his mother was in a car accident he does not want a wait for discharge instructions he wants the IV pulled he wants to go home.  Return precautions and follow-up given and understood.  Neurologically intact.  Patient is wide awake, did advise him not to drive the Benadryl we gave him.  However he has no evidence of somnolence.     ____________________________________________   FINAL CLINICAL IMPRESSION(S) / ED DIAGNOSES  Final diagnoses:  None      This chart was dictated using voice recognition software.  Despite best efforts to proofread,  errors can occur which can change meaning.      Jeanmarie PlantMcShane, Maury Groninger A, MD 01/22/19 1308    Jeanmarie PlantMcShane, Larz Mark A, MD 01/22/19 1330    Jeanmarie PlantMcShane, Tyjanae Bartek A, MD 01/22/19 1331

## 2019-09-03 ENCOUNTER — Other Ambulatory Visit: Payer: Self-pay

## 2019-09-03 ENCOUNTER — Encounter: Payer: Self-pay | Admitting: Emergency Medicine

## 2019-09-03 ENCOUNTER — Emergency Department
Admission: EM | Admit: 2019-09-03 | Discharge: 2019-09-03 | Disposition: A | Discharge status: Home / Self Care | Payer: HRSA Program | Attending: Emergency Medicine | Admitting: Emergency Medicine

## 2019-09-03 DIAGNOSIS — Z20822 Contact with and (suspected) exposure to covid-19: Secondary | ICD-10-CM | POA: Insufficient documentation

## 2019-09-03 DIAGNOSIS — F17210 Nicotine dependence, cigarettes, uncomplicated: Secondary | ICD-10-CM | POA: Diagnosis not present

## 2019-09-03 DIAGNOSIS — B349 Viral infection, unspecified: Secondary | ICD-10-CM | POA: Insufficient documentation

## 2019-09-03 DIAGNOSIS — J029 Acute pharyngitis, unspecified: Secondary | ICD-10-CM | POA: Diagnosis present

## 2019-09-03 LAB — SARS CORONAVIRUS 2 (TAT 6-24 HRS): SARS Coronavirus 2: NEGATIVE

## 2019-09-03 NOTE — Discharge Instructions (Signed)
Follow-up with your primary care provider if any continued problems.  Increase fluids.  Tylenol or ibuprofen if needed for body aches or fever.  A Covid test was done while you were in the ED.  You can log onto my chart to see the information most likely resulting tonight.  A note is provided for work.

## 2019-09-03 NOTE — ED Triage Notes (Signed)
Pt reports had a fever and general bodyaches for a few days. Pt reports went back to work today but his boss told he that he needed a COVID test first.

## 2019-09-03 NOTE — ED Provider Notes (Signed)
[facility] Emergency Department Provider Note  ____________________________________________   First MD Initiated Contact with Patient 09/03/19 [phone]     (approximate)  I have reviewed the triage vital signs and the nursing notes.   HISTORY  Chief Complaint Fever and Generalized Body Aches    HPI ARAVIND CHRISMER is a 44 y.o. male presents to the ED with complaint of body aches, sore throat, rhinorrhea that began 4 days ago.  Patient states that he was feeling better and was going back to work today when his employer asked him to be tested for Covid before returning to work.  Patient states that no one at his home has symptoms currently.  He states that he and his son were sick several weeks ago and he tested negative at that time.       Past Medical History:  Diagnosis Date  . Degenerative disc disease, lumbar     There are no problems to display for this patient.   Past Surgical History:  Procedure Laterality Date  . APPENDECTOMY    . JOINT REPLACEMENT      Prior to Admission medications   Medication Sig Start Date End Date Taking? Authorizing Provider  acetaminophen (TYLENOL) 325 MG tablet Take 650 mg by mouth every 6 (six) hours as needed.    [provider]    Allergies Patient has no known allergies.  No family history on file.  Social History Social History   Tobacco Use  . Smoking status: Current Every Day Smoker    Packs/day: 0.50    Types: Cigarettes  . Smokeless tobacco: Never Used  Substance Use Topics  . Alcohol use: No  . Drug use: No    Review of Systems Constitutional: Subjective fever/no chills ENT: Positive sore throat. Cardiovascular: Denies chest pain. Respiratory: Denies shortness of breath.  Denies cough. Gastrointestinal: No abdominal pain.  No nausea, no vomiting.  No diarrhea.  No constipation. Genitourinary: Negative for dysuria. Musculoskeletal: Positive for body aches. Skin: Negative for  rash. Neurological: Negative for headaches, focal weakness or numbness. ___________________________________________   PHYSICAL EXAM:  VITAL SIGNS: ED Triage Vitals  Enc Vitals Group     BP 09/03/19 0845 129/88     Pulse Rate 09/03/19 0845 88     Resp 09/03/19 0845 16     Temp 09/03/19 0845 98.4 F (36.9 C)     Temp Source 09/03/19 0845 Oral     SpO2 09/03/19 0845 97 %     Weight 09/03/19 0843 195 lb (88.5 kg)     Height 09/03/19 0843 5\' 11"  (1.803 m)     Head Circumference --      Peak Flow --      Pain Score 09/03/19 0842 4     Pain Loc --      Pain Edu? --      Excl. in GC? --    Constitutional: Alert and oriented. Well appearing and in no acute distress. Eyes: Conjunctivae are normal.  Head: Atraumatic. Nose: No congestion/rhinnorhea. Neck: No stridor.   Cardiovascular: Normal rate, regular rhythm. Grossly normal heart sounds.  Good peripheral circulation. Respiratory: Normal respiratory effort.  No retractions. Lungs CTAB. Musculoskeletal: Moves upper and lower extremities without any difficulty normal gait was noted. Neurologic:  Normal speech and language. No gross focal neurologic deficits are appreciated. No gait instability. Skin:  Skin is warm, dry and intact. No rash noted. Psychiatric: Mood and affect are normal. Speech and behavior are normal.  ____________________________________________  LABS (all labs ordered are listed, but only abnormal results are displayed)  Labs Reviewed  SARS CORONAVIRUS 2 (TAT 6-24 HRS)    PROCEDURES  Procedure(s) performed (including Critical Care):  Procedures   ____________________________________________   INITIAL IMPRESSION / ASSESSMENT AND PLAN / ED COURSE  As part of my medical decision making, I reviewed the following data within the electronic MEDICAL RECORD NUMBER Notes from prior ED visits and [place] Controlled Substance Database  44 year old male presents to the ED for Covid testing.  Patient states that he felt  bad 4 days ago when he had body aches, sore throat and rhinorrhea.  Patient states that he went to work today and his employer stated that he needed a Covid test before returning back to work.  Patient is afebrile with an O2 sat of 97%.  Patient is aware that he can check his Covid test results on my chart.  A note was given for his employer.  ____________________________________________   FINAL CLINICAL IMPRESSION(S) / ED DIAGNOSES  Final diagnoses:  Viral illness     ED Discharge Orders    None       Note:  This document was prepared using Dragon voice recognition software and may include unintentional dictation errors.    Johnn Hai, PA-C 09/03/19 1035    Nance Pear, MD 09/03/19 1316

## 2019-09-03 NOTE — ED Notes (Signed)
Pt c/o having body aches, sore throat runny nose on Saturday, states he was feeling better today and was going back to work and his employer told him he had to get tested first.

## 2020-10-12 ENCOUNTER — Other Ambulatory Visit: Payer: Self-pay

## 2020-10-12 ENCOUNTER — Emergency Department
Admission: EM | Admit: 2020-10-12 | Discharge: 2020-10-12 | Disposition: A | Discharge status: Home / Self Care | Payer: Self-pay | Attending: Emergency Medicine | Admitting: Emergency Medicine

## 2020-10-12 DIAGNOSIS — Z9660 Presence of unspecified orthopedic joint implant: Secondary | ICD-10-CM | POA: Insufficient documentation

## 2020-10-12 DIAGNOSIS — M5430 Sciatica, unspecified side: Secondary | ICD-10-CM

## 2020-10-12 DIAGNOSIS — F17210 Nicotine dependence, cigarettes, uncomplicated: Secondary | ICD-10-CM | POA: Insufficient documentation

## 2020-10-12 NOTE — Discharge Instructions (Signed)
Steps to find a Primary Care Provider (PCP): ° °Call [phone] or [phone] to access "[facility] Find a Doctor Service." ° °2.  You may also go on the [facility] website at [email or web address] ° ° °You may alternate Tylenol 1000 mg every 6 hours as needed for pain, fever and Ibuprofen 800 mg every 8 hours as needed for pain, fever.  Please take Ibuprofen with food.  Do not take more than 4000 mg of Tylenol (acetaminophen) in a 24 hour period. ° °

## 2020-10-12 NOTE — ED Triage Notes (Signed)
Pt in with co left lower back pain hx of sciatica. States did not go to work and now boss requesting work note before returning.

## 2020-10-12 NOTE — ED Provider Notes (Signed)
[facility] Emergency Department Provider Note  ____________________________________________   Event Date/Time   First MD Initiated Contact with Patient 10/12/20 [phone]     (approximate)  I have reviewed the triage vital signs and the nursing notes.   HISTORY  Chief Complaint Back Pain    HPI Omar Fernandez is a 45 y.o. male with history of chronic back pain who presents to the emergency department requesting a work note.  He states that he has intermittent problems with sciatica from time to time and he will do stretches and take ibuprofen and his symptoms resolved.  He states that yesterday he took the day off of work to help give his back a chance to rest and his symptoms to improve but his boss told him that he needed to come to the hospital to get a work note in order to come back to work.  He states his back pain has improved and is now 2/10.  He states he does not want any medication for his back pain and is here for a work note.  He denies numbness, tingling, weakness, bowel or bladder incontinence, fever.  No new injury to the back.        Past Medical History:  Diagnosis Date  . Degenerative disc disease, lumbar     There are no problems to display for this patient.   Past Surgical History:  Procedure Laterality Date  . APPENDECTOMY    . JOINT REPLACEMENT      Prior to Admission medications   Medication Sig Start Date End Date Taking? Authorizing Provider  acetaminophen (TYLENOL) 325 MG tablet Take 650 mg by mouth every 6 (six) hours as needed.    [provider]    Allergies Patient has no known allergies.  No family history on file.  Social History Social History   Tobacco Use  . Smoking status: Current Every Day Smoker    Packs/day: 0.50    Types: Cigarettes  . Smokeless tobacco: Never Used  Substance Use Topics  . Alcohol use: No  . Drug use: No    Review of Systems Constitutional: No fever. Eyes: No  visual changes. ENT: No sore throat. Cardiovascular: Denies chest pain. Respiratory: Denies shortness of breath. Gastrointestinal: No nausea, vomiting, diarrhea. Genitourinary: Negative for dysuria. Musculoskeletal: Negative for back pain. Skin: Negative for rash. Neurological: Negative for focal weakness or numbness.  ____________________________________________   PHYSICAL EXAM:  VITAL SIGNS: ED Triage Vitals [10/12/20 0047]  Enc Vitals Group     BP 119/88     Pulse Rate 89     Resp 18     Temp 98.5 F (36.9 C)     Temp Source Oral     SpO2 99 %     Weight 180 lb (81.6 kg)     Height 5\' 11"  (1.803 m)     Head Circumference      Peak Flow      Pain Score 5     Pain Loc      Pain Edu?      Excl. in GC?    CONSTITUTIONAL: Alert and oriented and responds appropriately to questions. Well-appearing; well-nourished HEAD: Normocephalic EYES: Conjunctivae clear, pupils appear equal, EOM appear intact ENT: normal nose; moist mucous membranes NECK: Supple, normal ROM CARD: RRR; S1 and S2 appreciated; no murmurs, no clicks, no rubs, no gallops RESP: Normal chest excursion without splinting or tachypnea; breath sounds clear and equal bilaterally; no wheezes, no rhonchi, no  rales, no hypoxia or respiratory distress, speaking full sentences ABD/GI: Nondistended BACK: The back appears normal, no midline spinal tenderness or step-off or deformity EXT: Normal ROM in all joints; no deformity noted, no edema; no cyanosis SKIN: Normal color for age and race; warm; no rash on exposed skin NEURO: Moves all extremities equally, normal sensation in bilateral lower extremities, no saddle anesthesia, normal gait PSYCH: The patient's mood and manner are appropriate.  ____________________________________________   LABS (all labs ordered are listed, but only abnormal results are displayed)  Labs Reviewed - No data to  display ____________________________________________  EKG  None ____________________________________________  RADIOLOGY I, Annistyn Depass, personally viewed and evaluated these images (plain radiographs) as part of my medical decision making, as well as reviewing the written report by the radiologist.  ED MD interpretation: None  Official radiology report(s): No results found.  ____________________________________________   PROCEDURES  Procedure(s) performed (including Critical Care):  Procedures   ____________________________________________   INITIAL IMPRESSION / ASSESSMENT AND PLAN / ED COURSE  As part of my medical decision making, I reviewed the following data within the electronic MEDICAL RECORD NUMBER Nursing notes reviewed and incorporated, Old chart reviewed and Notes from prior ED visits         Patient here requesting an work note.  Has history of sciatica and states he took the day off work yesterday Monday the seventh due to his back pain.  He has been stretching and taking ibuprofen reports his pain has now significantly improved.  No focal neurologic deficits, red flag symptoms to suggest cauda equina, epidural abscess or hematoma, discitis or osteomyelitis, transverse myelitis, fracture.  Will provide him with a work note.  He would like to go back to work this morning.  Recommended continuing Tylenol and Motrin for continued back pain.  At this time, I do not feel there is any life-threatening condition present. I have reviewed, interpreted and discussed all results (EKG, imaging, lab, urine as appropriate) and exam findings with patient/family. I have reviewed nursing notes and appropriate previous records.  I feel the patient is safe to be discharged home without further emergent workup and can continue workup as an outpatient as needed. Discussed usual and customary return precautions. Patient/family verbalize understanding and are comfortable with this plan.   Outpatient follow-up has been provided as needed. All questions have been answered.  ____________________________________________   FINAL CLINICAL IMPRESSION(S) / ED DIAGNOSES  Final diagnoses:  Sciatica, unspecified laterality     ED Discharge Orders    None      *Please note:  Omar Fernandez was evaluated in Emergency Department on 10/12/2020 for the symptoms described in the history of present illness. He was evaluated in the context of the global COVID-19 pandemic, which necessitated consideration that the patient might be at risk for infection with the SARS-CoV-2 virus that causes COVID-19. Institutional protocols and algorithms that pertain to the evaluation of patients at risk for COVID-19 are in a state of rapid change based on information released by regulatory bodies including the CDC and federal and state organizations. These policies and algorithms were followed during the patient's care in the ED.  Some ED evaluations and interventions may be delayed as a result of limited staffing during and the pandemic.*   Note:  This document was prepared using Dragon voice recognition software and may include unintentional dictation errors.   Chandra Feger, Layla Maw, DO 10/12/20 [phone]

## 2021-01-10 ENCOUNTER — Other Ambulatory Visit: Payer: Self-pay

## 2021-01-10 ENCOUNTER — Emergency Department (HOSPITAL_COMMUNITY): Payer: Self-pay

## 2021-01-10 ENCOUNTER — Emergency Department (HOSPITAL_COMMUNITY)
Admission: EM | Admit: 2021-01-10 | Discharge: 2021-01-10 | Disposition: A | Discharge status: Home / Self Care | Payer: Self-pay | Attending: Emergency Medicine | Admitting: Emergency Medicine

## 2021-01-10 ENCOUNTER — Encounter (HOSPITAL_COMMUNITY): Payer: Self-pay

## 2021-01-10 DIAGNOSIS — Y93E1 Activity, personal bathing and showering: Secondary | ICD-10-CM | POA: Insufficient documentation

## 2021-01-10 DIAGNOSIS — Y92002 Bathroom of unspecified non-institutional (private) residence single-family (private) house as the place of occurrence of the external cause: Secondary | ICD-10-CM | POA: Insufficient documentation

## 2021-01-10 DIAGNOSIS — Z9660 Presence of unspecified orthopedic joint implant: Secondary | ICD-10-CM | POA: Insufficient documentation

## 2021-01-10 DIAGNOSIS — F17210 Nicotine dependence, cigarettes, uncomplicated: Secondary | ICD-10-CM | POA: Insufficient documentation

## 2021-01-10 DIAGNOSIS — M533 Sacrococcygeal disorders, not elsewhere classified: Secondary | ICD-10-CM | POA: Insufficient documentation

## 2021-01-10 DIAGNOSIS — W010XXA Fall on same level from slipping, tripping and stumbling without subsequent striking against object, initial encounter: Secondary | ICD-10-CM | POA: Insufficient documentation

## 2021-01-10 MED ORDER — IBUPROFEN 800 MG PO TABS
800.0000 mg | ORAL_TABLET | Freq: Once | ORAL | Status: AC
  Administered 2021-01-10: 800 mg via ORAL
  Filled 2021-01-10: qty 1

## 2021-01-10 NOTE — ED Triage Notes (Signed)
Pt reports slipped getting into the shower last night and landed on tailbone.

## 2021-01-10 NOTE — Discharge Instructions (Signed)
You were evaluated in the Emergency Department and after careful evaluation, we did not find any emergent condition requiring admission or further testing in the hospital.  Your x-rays today were reassuring.  Please continue to take ibuprofen for pain.  As discussed, unfortunately sacral injuries can take quite some time to heal.  If your symptoms continue please follow-up with your primary care doctor.  Please return to the Emergency Department if you experience any worsening of your condition.   Thank you for allowing [place] to be a part of your care.

## 2021-01-10 NOTE — ED Provider Notes (Signed)
[facility] EMERGENCY DEPARTMENT Provider Note   CSN: [id number] Arrival date & time: 01/10/21  1115     History Chief Complaint  Patient presents with  . Fall    Omar Fernandez is a 45 y.o. male.  HPI 45 year old male presents to the ER with complaints of sacral pain after a fall yesterday.  Patient states he got into the shower and slipped on some soap, landing directly on his tailbone.  Denies any numbness or dribbling but has pain with ambulation.  Took 1 pill of 800 mg of ibuprofen with little relief.    Past Medical History:  Diagnosis Date  . Degenerative disc disease, lumbar     There are no problems to display for this patient.   Past Surgical History:  Procedure Laterality Date  . APPENDECTOMY    . JOINT REPLACEMENT         No family history on file.  Social History   Tobacco Use  . Smoking status: Current Every Day Smoker    Packs/day: 0.50    Types: Cigarettes  . Smokeless tobacco: Never Used  Substance Use Topics  . Alcohol use: No  . Drug use: No    Home Medications Prior to Admission medications   Medication Sig Start Date End Date Taking? Authorizing Provider  acetaminophen (TYLENOL) 325 MG tablet Take 650 mg by mouth every 6 (six) hours as needed.    [provider]    Allergies    Patient has no known allergies.  Review of Systems   Review of Systems  Musculoskeletal: Positive for arthralgias.  Neurological: Negative for weakness and numbness.    Physical Exam Updated Vital Signs BP (!) 127/92   Pulse 77   Temp 98.6 F (37 C)   Resp 18   Ht 5\' 11"  (1.803 m)   Wt 83.9 kg   SpO2 99%   BMI 25.80 kg/m   Physical Exam Vitals reviewed.  Constitutional:      Appearance: Normal appearance.  HENT:     Head: Normocephalic and atraumatic.  Eyes:     General:        Right eye: No discharge.        Left eye: No discharge.     Extraocular Movements: Extraocular movements intact.     Conjunctiva/sclera: Conjunctivae  normal.  Musculoskeletal:        General: Tenderness present. No swelling. Normal range of motion.     Right lower leg: No edema.     Left lower leg: No edema.     Comments: Midline tenderness to the C, T, L-spine.  Tenderness palpation over the sacrum with no noticeable step-offs or crepitus.  No skin tenting.  Patient ambulated in the ER room gingerly but with no evidence of foot drop.  Neurovascularly intact in his lower extremities bilaterally.  Neurological:     General: No focal deficit present.     Mental Status: He is alert and oriented to person, place, and time.  Psychiatric:        Mood and Affect: Mood normal.        Behavior: Behavior normal.     ED Results / Procedures / Treatments   Labs (all labs ordered are listed, but only abnormal results are displayed) Labs Reviewed - No data to display  EKG None  Radiology DG Sacrum/Coccyx  Result Date: 01/10/2021 CLINICAL DATA:  Tail bone pain after fall last night. EXAM: SACRUM AND COCCYX - 2+ VIEW COMPARISON:  February 11, 2015.  October 30, 2016. FINDINGS: There is no evidence of fracture or other focal bone lesions. Grossly stable spondylolisthesis of L5-S1 secondary to bilateral L5 spondylolysis. IMPRESSION: No acute abnormality seen. Electronically Signed   By: Lupita Raider M.D.   On: 01/10/2021 12:51    Procedures Procedures   Medications Ordered in ED Medications  ibuprofen (ADVIL) tablet 800 mg (has no administration in time range)    ED Course  I have reviewed the triage vital signs and the nursing notes.  Pertinent labs & imaging results that were available during my care of the patient were reviewed by me and considered in my medical decision making (see chart for details).    MDM Rules/Calculators/A&P                          45 year old male with sacral pain after a fall.  Plain films with no evidence of fractures.  No evidence of lumbar spine injury given no midline tenderness.  Patient still able to  ambulate but with some pain.  Encouraged continuing to take ibuprofen, following up with PCP.  We discussed return precautions.  Was understanding is agreeable.  Stable for discharge Final Clinical Impression(s) / ED Diagnoses Final diagnoses:  Sacral pain    Rx / DC Orders ED Discharge Orders    None       Mare Ferrari, PA-C 01/10/21 1324    Derwood Kaplan, MD 01/11/21 1225

## 2021-08-25 ENCOUNTER — Encounter: Payer: Self-pay | Admitting: Emergency Medicine

## 2021-08-25 ENCOUNTER — Emergency Department
Admission: EM | Admit: 2021-08-25 | Discharge: 2021-08-25 | Disposition: A | Discharge status: Home / Self Care | Payer: No Typology Code available for payment source | Attending: Emergency Medicine | Admitting: Emergency Medicine

## 2021-08-25 ENCOUNTER — Other Ambulatory Visit: Payer: Self-pay

## 2021-08-25 DIAGNOSIS — M5432 Sciatica, left side: Secondary | ICD-10-CM | POA: Insufficient documentation

## 2021-08-25 NOTE — ED Triage Notes (Signed)
Pt states that he has had sciatica over the years so he stayed out of work a couple days and wanted to return to work today and his boss requested that he get a work note stating he is ok to come back to work

## 2021-08-25 NOTE — Discharge Instructions (Signed)
You will need to follow-up with your primary care at the [facility] for continued evaluation and management of your sciatic pain.

## 2021-08-25 NOTE — ED Provider Notes (Signed)
[facility] Provider Note    Event Date/Time   First MD Initiated Contact with Patient 08/25/21 1127     (approximate)   History   work note   HPI  ARK AGRUSA is a 46 y.o. male presents to the ED with complaint of sciatic pain that occasionally flares up and has in the past.  Patient reports that he took a couple of days off from work and now is feeling better.  He states he called this morning and his boss will not let him come back to work until he gets a note stating he may return.  Patient denies any pain at this time.  He states that he took over-the-counter NSAID.  He denies any recent injury.  No symptoms to suggest cauda equina.  Patient is ambulatory without any assistance.     Physical Exam   Triage Vital Signs: ED Triage Vitals  Enc Vitals Group     BP 08/25/21 1038 129/82     Pulse Rate 08/25/21 1038 87     Resp 08/25/21 1038 18     Temp 08/25/21 1038 98.6 F (37 C)     Temp Source 08/25/21 1038 Oral     SpO2 08/25/21 1038 98 %     Weight 08/25/21 1039 195 lb (88.5 kg)     Height 08/25/21 1039 5\' 11"  (1.803 m)     Head Circumference --      Peak Flow --      Pain Score 08/25/21 1039 3     Pain Loc --      Pain Edu? --      Excl. in GC? --     Most recent vital signs: Vitals:   08/25/21 1038  BP: 129/82  Pulse: 87  Resp: 18  Temp: 98.6 F (37 C)  SpO2: 98%     General: Awake, no distress.  CV:  Good peripheral perfusion.  Heart regular rate and rhythm without murmur. Resp:  Normal effort.  Lungs are clear bilaterally. Abd:  No distention.  Other:  Moves upper and lower extremities that any difficulty.  Normal gait was noted.  No assistance was needed.  Patient denies any symptoms at this time during exam.   ED Results / Procedures / Treatments   Labs (all labs ordered are listed, but only abnormal results are displayed) Labs Reviewed - No data to display   PROCEDURES:  Critical Care performed:  No  Procedures   MEDICATIONS ORDERED IN ED: Medications - No data to display   IMPRESSION / MDM / ASSESSMENT AND PLAN / ED COURSE  I reviewed the triage vital signs and the nursing notes.    Differential diagnosis includes, but is not limited to, chronic back pain, intermittent sciatica, urinary tract infection, cauda equina, compression fracture.  46 year old male presents to the ED for a note to return to work.  Patient reports that he has had a history of sciatica and took several days off from work taking NSAIDs at home and now is much better.  He states that his boss told him that he needed a note to return to work.  Patient has a history of degenerative disc disease in reviewing his past medical history and records.  No symptoms or suspicion of cauda equina.  Patient is ambulatory without any assistance.  No pain at this time.  Patient was given a note to return to work.  He is to [facility] for further evaluation and management of his sciatica.    FINAL CLINICAL IMPRESSION(S) / ED DIAGNOSES   Final diagnoses:  Sciatica, left side     Rx / DC Orders   ED Discharge Orders     None        Note:  This document was prepared using Dragon voice recognition software and may include unintentional dictation errors.   Tommi Rumps, PA-C 08/25/21 1222    Delton Prairie, MD 08/25/21 1450

## 2022-02-14 ENCOUNTER — Encounter (HOSPITAL_COMMUNITY): Payer: Self-pay

## 2022-02-14 ENCOUNTER — Emergency Department (HOSPITAL_COMMUNITY)
Admission: EM | Admit: 2022-02-14 | Discharge: 2022-02-14 | Disposition: A | Discharge status: Home / Self Care | Payer: Commercial Managed Care - HMO | Attending: Emergency Medicine | Admitting: Emergency Medicine

## 2022-02-14 ENCOUNTER — Other Ambulatory Visit: Payer: Self-pay

## 2022-02-14 DIAGNOSIS — G43909 Migraine, unspecified, not intractable, without status migrainosus: Secondary | ICD-10-CM | POA: Diagnosis not present

## 2022-02-14 DIAGNOSIS — R519 Headache, unspecified: Secondary | ICD-10-CM | POA: Diagnosis present

## 2022-02-14 DIAGNOSIS — G43001 Migraine without aura, not intractable, with status migrainosus: Secondary | ICD-10-CM

## 2022-02-14 MED ORDER — DEXAMETHASONE SODIUM PHOSPHATE 10 MG/ML IJ SOLN
10.0000 mg | Freq: Once | INTRAMUSCULAR | Status: AC
  Administered 2022-02-14: 10 mg via INTRAMUSCULAR
  Filled 2022-02-14: qty 1

## 2022-02-14 MED ORDER — KETOROLAC TROMETHAMINE 30 MG/ML IJ SOLN
30.0000 mg | Freq: Once | INTRAMUSCULAR | Status: AC
  Administered 2022-02-14: 30 mg via INTRAMUSCULAR
  Filled 2022-02-14: qty 1

## 2022-02-14 MED ORDER — METOCLOPRAMIDE HCL 10 MG PO TABS
10.0000 mg | ORAL_TABLET | Freq: Once | ORAL | Status: AC
  Administered 2022-02-14: 10 mg via ORAL
  Filled 2022-02-14 (×2): qty 1

## 2022-02-14 MED ORDER — DIPHENHYDRAMINE HCL 25 MG PO CAPS
25.0000 mg | ORAL_CAPSULE | Freq: Once | ORAL | Status: AC
  Administered 2022-02-14: 25 mg via ORAL
  Filled 2022-02-14: qty 1

## 2022-02-14 NOTE — ED Triage Notes (Signed)
Pt arrived POV from home c/o a headache x4 days. Pt states he has tried tylenol and ibuprofen with minimal relief.

## 2022-02-14 NOTE — ED Provider Triage Note (Signed)
Emergency Medicine Provider Triage Evaluation Note  Omar Fernandez , a 46 y.o. male  was evaluated in triage.  Pt complains of left-sided migraine x4 days.  He has no prior history of migraines but states that his daughter has them.  He has photosensitivity and pain behind the left eye but no changes in vision.  He has tried Tylenol and ibuprofen without improvement in symptoms.  Denies slurred speech, facial droop, numbness, tingling, unilateral weakness.  Review of Systems  Positive:  Negative:   Physical Exam  BP (!) 112/96 (BP Location: Right Arm)   Pulse (!) 104   Temp 98.5 F (36.9 C) (Oral)   Resp 16   Ht 5\' 11"  (1.803 m)   Wt 86.2 kg   SpO2 96%   BMI 26.50 kg/m  Gen:   Awake, no distress   Resp:  Normal effort  MSK:   Moves extremities without difficulty  Other:  Eyes PERRL, EOM intact, grip strength intact bilaterally  Medical Decision Making  Medically screening exam initiated at 2:41 PM.  Appropriate orders placed.  Omar Fernandez was informed that the remainder of the evaluation will be completed by another provider, this initial triage assessment does not replace that evaluation, and the importance of remaining in the ED until their evaluation is complete.     Patrina Levering, Janell Quiet 02/14/22 1443

## 2022-02-14 NOTE — ED Provider Notes (Signed)
[facility] EMERGENCY DEPARTMENT Provider Note   CSN: [id number] Arrival date & time: 02/14/22  1337     History  Chief Complaint  Patient presents with   Headache    Omar Fernandez is a 46 y.o. male with history of DDD presents to the ED for evaluation of migraine headache x4 days.  Patient states pain is located on the left side of his head and causes pain behind the eyes without any changes in vision.  Pain came on gradually and has been steady.  Is not responding to Tylenol or ibuprofen.  He has no previous history of migraines although notes that his daughter has some.  He denies weakness, numbness, tingling, slurred speech, nausea, vomiting, abdominal pain, diarrhea.  Headache Associated symptoms: no numbness and no weakness    Headache Associated symptoms: no numbness        Home Medications Prior to Admission medications   Medication Sig Start Date End Date Taking? Authorizing Provider  acetaminophen (TYLENOL) 325 MG tablet Take 650 mg by mouth every 6 (six) hours as needed.    [provider]      Allergies    Patient has no known allergies.    Review of Systems   Review of Systems  Cardiovascular:  Negative for chest pain.  Musculoskeletal:  Negative for gait problem.  Neurological:  Positive for headaches. Negative for speech difficulty, weakness and numbness.    Physical Exam Updated Vital Signs BP (!) 112/96 (BP Location: Right Arm)   Pulse (!) 104   Temp 98.5 F (36.9 C) (Oral)   Resp 16   Ht 5\' 11"  (1.803 m)   Wt 86.2 kg   SpO2 96%   BMI 26.50 kg/m  Physical Exam Vitals and nursing note reviewed.  Constitutional:      General: He is not in acute distress.    Appearance: He is not ill-appearing.  HENT:     Head: Atraumatic.  Eyes:     Extraocular Movements: Extraocular movements intact.     Conjunctiva/sclera: Conjunctivae normal.     Pupils: Pupils are equal, round, and reactive to light.  Cardiovascular:      Rate and Rhythm: Normal rate and regular rhythm.     Pulses: Normal pulses.     Heart sounds: No murmur heard. Pulmonary:     Effort: Pulmonary effort is normal. No respiratory distress.     Breath sounds: Normal breath sounds.  Abdominal:     General: Abdomen is flat. There is no distension.     Palpations: Abdomen is soft.     Tenderness: There is no abdominal tenderness.  Musculoskeletal:        General: Normal range of motion.     Cervical back: Normal range of motion.  Skin:    General: Skin is warm and dry.     Capillary Refill: Capillary refill takes less than 2 seconds.  Neurological:     General: No focal deficit present.     Mental Status: He is alert.     Comments: Speech is clear, able to follow commands CN III-XII intact Normal strength in upper and lower extremities bilaterally including dorsiflexion and plantar flexion, strong and equal grip strength Subjective sensation intact Moves extremities without ataxia, coordination intact Normal finger to nose and rapid alternating movements No pronator drift    Psychiatric:        Mood and Affect: Mood normal.     ED Results / Procedures / Treatments  Labs (all labs ordered are listed, but only abnormal results are displayed) Labs Reviewed - No data to display  EKG None  Radiology No results found.  Procedures Procedures    Medications Ordered in ED Medications  dexamethasone (DECADRON) injection 10 mg (has no administration in time range)  metoCLOPramide (REGLAN) tablet 10 mg (has no administration in time range)  diphenhydrAMINE (BENADRYL) capsule 25 mg (has no administration in time range)  ketorolac (TORADOL) 30 MG/ML injection 30 mg (30 mg Intramuscular Given 02/14/22 1449)    ED Course/ Medical Decision Making/ A&P                           Medical Decision Making Risk Prescription drug management.   46 year old male presents to the ED for evaluation of migraine headache.  I evaluated him  in triage and physical exam was overall reassuring.  Pupils were equal round reactive to light.  Neuro exam normal.  I discussed with him that he likely does not require any CT imaging or labs, but he will need to wait in order to have migraine management done in the back.  Patient states that there are a large amount of people waiting in the waiting room and he did not want to waste a room and is requesting discharge.  Discussed with him that we can try to give him some of the medications here in triage that may help with his migraine before he goes, however there is a risk that it may not help and he will eventually need to come back.  And patient is aware of risks and wishes to be discharged anyway.  He was given Decadron 10 mg IM, Toradol 30 mg IM, Reglan 10 mg p.o., Benadryl 25 mg p.o.  We discussed strict return precautions and when to return to the emergency department.  He expresses understanding is amenable to plan.        Final Clinical Impression(s) / ED Diagnoses Final diagnoses:  None    Rx / DC Orders ED Discharge Orders     None         Delight Ovens 02/14/22 1504    Jacalyn Lefevre, MD 02/14/22 1625

## 2022-02-14 NOTE — Discharge Instructions (Addendum)
Since you do not wish to wait for room here in the back for management of your migraine, I have given you some medications here in triage that hopefully will help relieve your symptoms.  Please drink at least 1 to 2 L of water when you get home and get some rest.   If symptoms do not improve over the next day or 2, please return to the emergency department.  Additionally, if you develop worsening pain, changes in vision, slurred speech, weakness on one side of the body or the other, please return to the emergency department immediately.

## 2023-03-20 ENCOUNTER — Encounter (HOSPITAL_COMMUNITY): Payer: Self-pay

## 2023-03-20 ENCOUNTER — Ambulatory Visit (HOSPITAL_COMMUNITY)
Admission: EM | Admit: 2023-03-20 | Discharge: 2023-03-20 | Disposition: A | Discharge status: Home / Self Care | Payer: Commercial Managed Care - HMO | Attending: Internal Medicine | Admitting: Internal Medicine

## 2023-03-20 ENCOUNTER — Emergency Department (HOSPITAL_COMMUNITY): Payer: Commercial Managed Care - HMO

## 2023-03-20 ENCOUNTER — Other Ambulatory Visit: Payer: Self-pay

## 2023-03-20 ENCOUNTER — Emergency Department (HOSPITAL_COMMUNITY)
Admission: EM | Admit: 2023-03-20 | Discharge: 2023-03-21 | Discharge status: Against Medical Advice | Payer: Commercial Managed Care - HMO | Attending: Medical | Admitting: Medical

## 2023-03-20 DIAGNOSIS — M25561 Pain in right knee: Secondary | ICD-10-CM

## 2023-03-20 DIAGNOSIS — Z5321 Procedure and treatment not carried out due to patient leaving prior to being seen by health care provider: Secondary | ICD-10-CM | POA: Diagnosis not present

## 2023-03-20 DIAGNOSIS — M25461 Effusion, right knee: Secondary | ICD-10-CM | POA: Insufficient documentation

## 2023-03-20 MED ORDER — PREDNISONE 20 MG PO TABS: 40.0000 mg | ORAL_TABLET | Freq: Every day | ORAL | 0 refills | Status: DC

## 2023-03-20 NOTE — ED Provider Notes (Signed)
[facility]    CSN: [id number] Arrival date & time: 03/20/23  1902      History   Chief Complaint Chief Complaint  Patient presents with   Joint Swelling    HPI Omar Fernandez is a 47 y.o. male.   Patient presents to urgent care for evaluation of pain to the lateral aspect of the right knee that has been ongoing over the last 5-6 months after injury. He tripped over a pallet while at work 5-6 months ago and experienced pain and swelling to the knee that resolved. He re-injured the right knee 2 weeks ago at work and the knee became swollen again. He works at an [employer] for living and does a lot of bending and lifting as well as walking up and down stairs.  He also performs a lot of squatting and repetitive movements lifting heavy objects, he suspects this has worsened knee pain and swelling.  No redness, warmth, rash to the knee.  Pain to the right knee is worsened by weightbearing and flexion at the right knee joint, improves with rest.  No history of DVT, recent long car/plane rides, shortness of breath, chest pain, or fevers.  Taking over-the-counter ibuprofen/Tylenol without relief.     Past Medical History:  Diagnosis Date   Degenerative disc disease, lumbar     There are no problems to display for this patient.   Past Surgical History:  Procedure Laterality Date   APPENDECTOMY     JOINT REPLACEMENT         Home Medications    Prior to Admission medications   Medication Sig Start Date End Date Taking? Authorizing Provider  ibuprofen (ADVIL) 200 MG tablet Take 200 mg by mouth every 6 (six) hours as needed for mild pain.    [provider]    Family History History reviewed. No pertinent family history.  Social History Social History   Tobacco Use   Smoking status: Every Day    Current packs/day: 0.50    Types: Cigarettes   Smokeless tobacco: Never  Substance Use Topics   Alcohol use: No   Drug use: No      Allergies   Patient has no known allergies.   Review of Systems Review of Systems Per HPI  Physical Exam Triage Vital Signs ED Triage Vitals [03/20/23 1931]  Encounter Vitals Group     BP (!) 128/92     Systolic BP Percentile      Diastolic BP Percentile      Pulse Rate 84     Resp 17     Temp 98.5 F (36.9 C)     Temp Source Oral     SpO2 98 %     Weight      Height      Head Circumference      Peak Flow      Pain Score 4     Pain Loc      Pain Education      Exclude from Growth Chart    No data found.  Updated Vital Signs BP (!) 128/92 (BP Location: Right Arm)   Pulse 84   Temp 98.5 F (36.9 C) (Oral)   Resp 17   SpO2 98%   Visual Acuity Right Eye Distance:   Left Eye Distance:   Bilateral Distance:    Right Eye Near:   Left Eye Near:    Bilateral Near:     Physical Exam Vitals  and nursing note reviewed.  Constitutional:      Appearance: He is not ill-appearing or toxic-appearing.  HENT:     Head: Normocephalic and atraumatic.     Right Ear: Hearing and external ear normal.     Left Ear: Hearing and external ear normal.     Nose: Nose normal.     Mouth/Throat:     Lips: Pink.  Eyes:     General: Lids are normal. Vision grossly intact. Gaze aligned appropriately.     Extraocular Movements: Extraocular movements intact.     Conjunctiva/sclera: Conjunctivae normal.  Pulmonary:     Effort: Pulmonary effort is normal.  Musculoskeletal:        General: Swelling present.     Cervical back: Neck supple.     Right knee: Swelling present. No deformity, erythema, ecchymosis, lacerations, bony tenderness or crepitus. Decreased range of motion (Secondary to pain). Tenderness present over the medial joint line and patellar tendon. No LCL laxity or MCL laxity. Normal alignment, normal meniscus and normal patellar mobility. Normal pulse (+2 right popliteal pulse).     Comments: Significantly tender to palpation of the posterior right knee with diffuse  swelling to the superior and lateral right knee.  Positive Homans' sign to the right knee/upper calf.  No warmth, erythema, or rash to overlying skin.  Skin:    General: Skin is warm and dry.     Capillary Refill: Capillary refill takes less than 2 seconds.     Findings: No rash.  Neurological:     General: No focal deficit present.     Mental Status: He is alert and oriented to person, place, and time. Mental status is at baseline.     Cranial Nerves: No dysarthria or facial asymmetry.  Psychiatric:        Mood and Affect: Mood normal.        Speech: Speech normal.        Behavior: Behavior normal.        Thought Content: Thought content normal.        Judgment: Judgment normal.      UC Treatments / Results  Labs (all labs ordered are listed, but only abnormal results are displayed) Labs Reviewed - No data to display  EKG   Radiology No results found.  Procedures Procedures (including critical care time)  Medications Ordered in UC Medications - No data to display  Initial Impression / Assessment and Plan / UC Course  I have reviewed the triage vital signs and the nursing notes.  Pertinent labs & imaging results that were available during my care of the patient were reviewed by me and considered in my medical decision making (see chart for details).   1.  Pain and swelling of right knee Positive right sided Homans' sign, pain is very severe.  Concern for DVT, therefore will send patient to the nearest emergency department for further evaluation as we are unable to complete ultrasound to rule out DVT in clinic.  Discussed risks of deferring ED visit, patient expresses understanding and agreement with plan.  Discharged from urgent care to the nearest emergency department via POV with his wife.   Final Clinical Impressions(s) / UC Diagnoses   Final diagnoses:  Pain and swelling of right knee     Discharge Instructions      Please go to the nearest ER for further  evaluation.      ED Prescriptions     Medication Sig Dispense Auth. Provider  predniSONE (DELTASONE) 20 MG tablet  (Status: Discontinued) Take 2 tablets (40 mg total) by mouth daily for 5 days. 10 tablet Carlisle Beers, FNP      PDMP not reviewed this encounter.   Carlisle Beers, [place] 03/24/23 2009

## 2023-03-20 NOTE — ED Notes (Signed)
Patient is being discharged from the Urgent Care and sent to the Emergency Department via self . Per provider, patient is in need of higher level of care due to r/o blood clot. Patient is aware and verbalizes understanding of plan of care.  Vitals:   03/20/23 1931  BP: (!) 128/92  Pulse: 84  Resp: 17  Temp: 98.5 F (36.9 C)  SpO2: 98%

## 2023-03-20 NOTE — ED Triage Notes (Signed)
Pt presents with c/o rt knee pain and swelling X 2 weeks. Pt states he tried cold and warm compressions.

## 2023-03-20 NOTE — ED Triage Notes (Addendum)
Patient BIB POV from UC for evaluation of possible blood clot in right leg.   Patient reports twisting right knee 2 weeks ago, since has had swelling, pain, and swelling to back of knee.   Pedal pulses present and marked, foot warm & dry, capillary refill <3 seconds.    Palpable swelling behind knee with tenderness, not hot to touch or red.

## 2023-03-20 NOTE — Discharge Instructions (Addendum)
Please go to the nearest ER for further evaluation.

## 2023-03-28 IMAGING — DX DG SACRUM/COCCYX 2+V
3 series · 3 of 3 positions shown · non-contrast
Comparison: [DATE] [DATE], [DATE].  [DATE] [DATE], [DATE].

CLINICAL DATA: Tail bone pain after fall last night.

EXAM:
SACRUM AND COCCYX - 2+ VIEW

[coccyx ap]
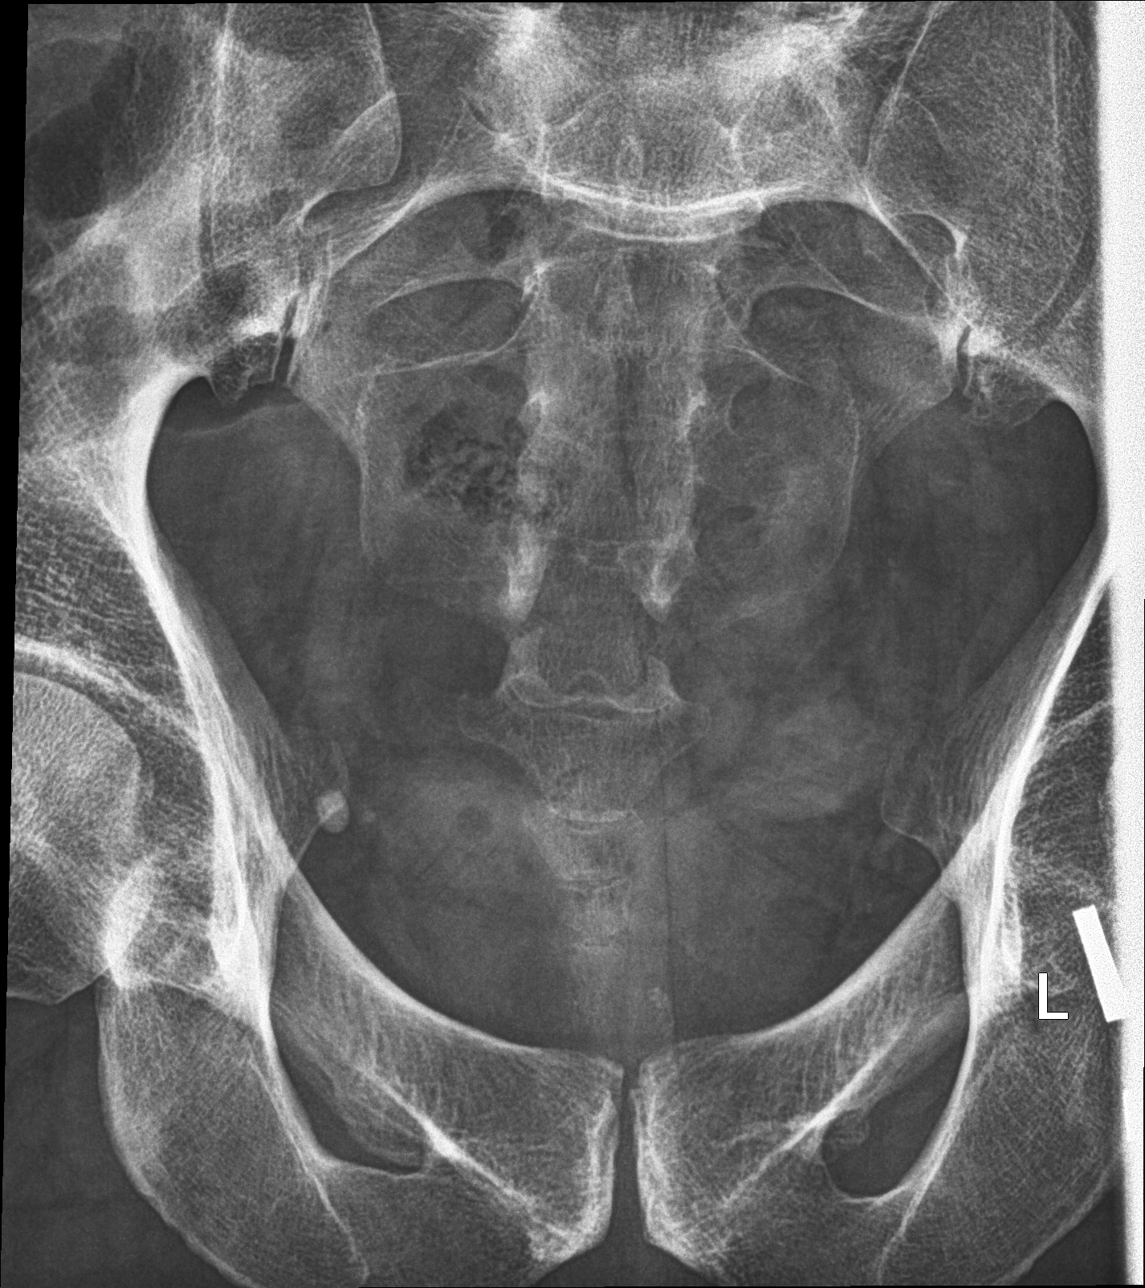

[sacrum ap]
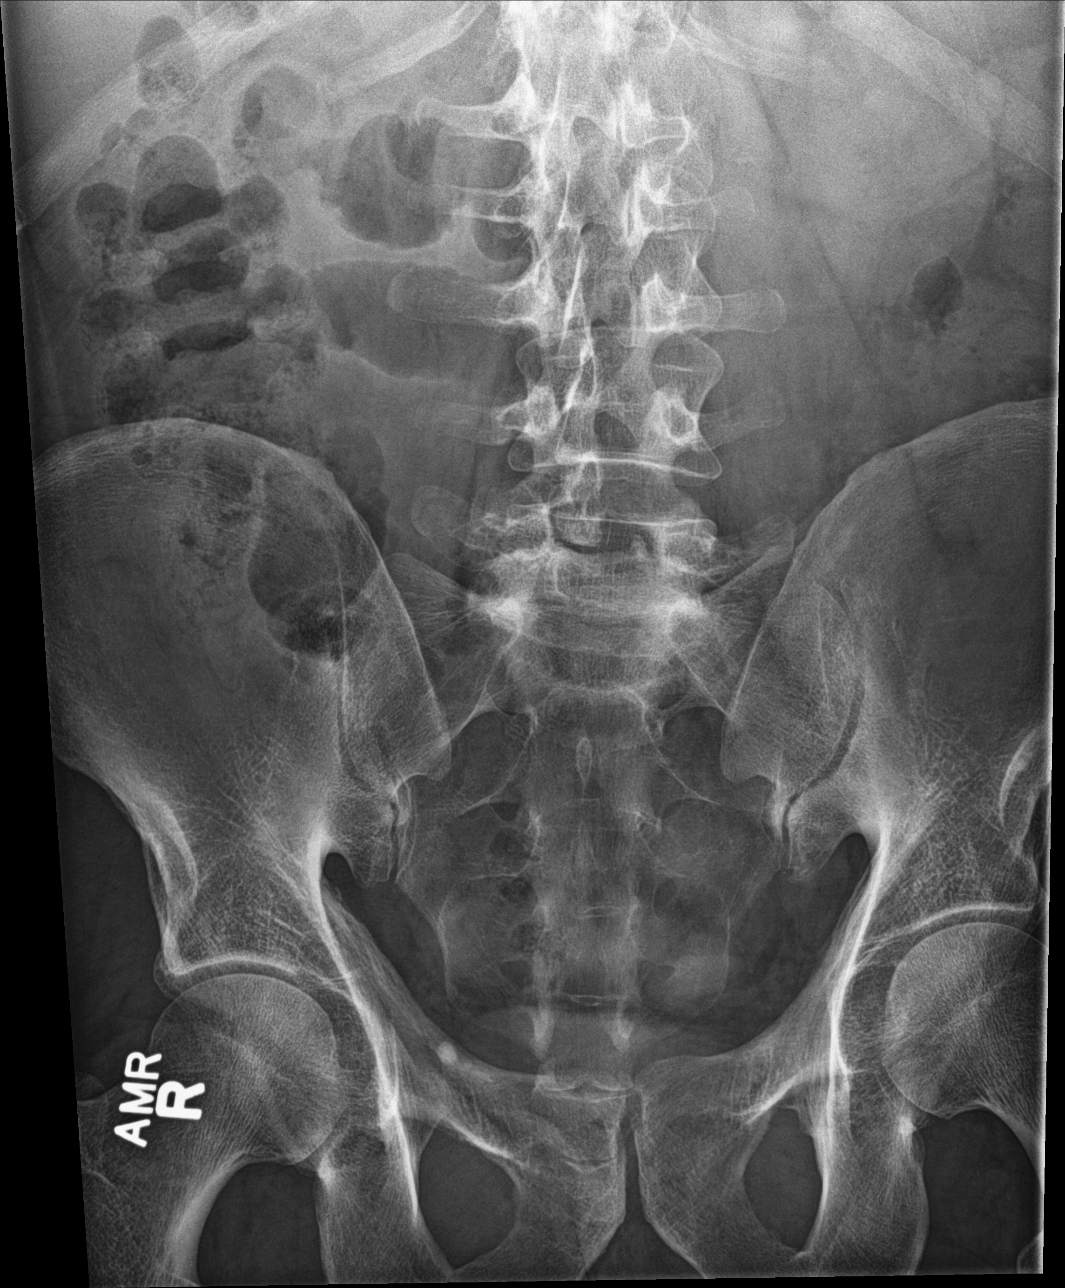

[sacrum lat]
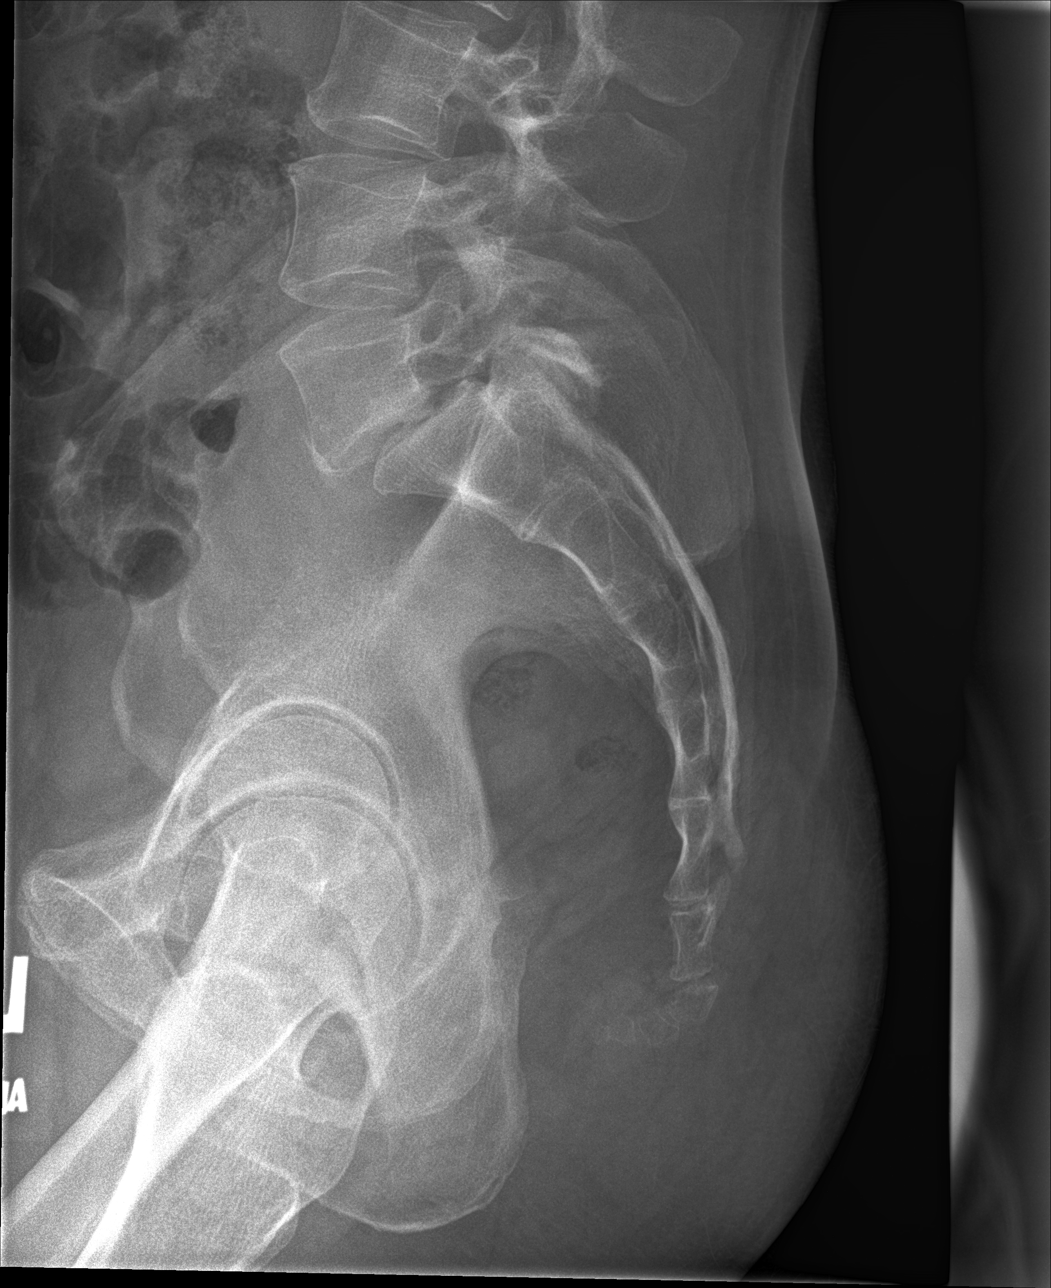

[3 of 3 positions shown; findings below may reference images not displayed]

FINDINGS: There is no evidence of fracture or other focal bone lesions.
Grossly stable spondylolisthesis of L5-S1 secondary to bilateral L5
spondylolysis.
IMPRESSION: No acute abnormality seen.

## 2023-05-06 ENCOUNTER — Emergency Department
Admission: EM | Admit: 2023-05-06 | Discharge: 2023-05-06 | Disposition: A | Discharge status: Home / Self Care | Payer: Commercial Managed Care - HMO | Attending: Emergency Medicine | Admitting: Emergency Medicine

## 2023-05-06 ENCOUNTER — Other Ambulatory Visit: Payer: Self-pay

## 2023-05-06 ENCOUNTER — Emergency Department: Payer: Commercial Managed Care - HMO

## 2023-05-06 DIAGNOSIS — R0789 Other chest pain: Secondary | ICD-10-CM | POA: Insufficient documentation

## 2023-05-06 DIAGNOSIS — M542 Cervicalgia: Secondary | ICD-10-CM | POA: Insufficient documentation

## 2023-05-06 DIAGNOSIS — Y92410 Unspecified street and highway as the place of occurrence of the external cause: Secondary | ICD-10-CM | POA: Insufficient documentation

## 2023-05-06 MED ORDER — CYCLOBENZAPRINE HCL 10 MG PO TABS
5.0000 mg | ORAL_TABLET | Freq: Once | ORAL | Status: AC
  Administered 2023-05-06: 5 mg via ORAL
  Filled 2023-05-06: qty 1

## 2023-05-06 MED ORDER — KETOROLAC TROMETHAMINE 15 MG/ML IJ SOLN
15.0000 mg | Freq: Once | INTRAMUSCULAR | Status: AC
  Administered 2023-05-06: 15 mg via INTRAMUSCULAR
  Filled 2023-05-06: qty 1

## 2023-05-06 MED ORDER — CYCLOBENZAPRINE HCL 5 MG PO TABS: 5.0000 mg | ORAL_TABLET | Freq: Three times a day (TID) | ORAL | 0 refills | Status: DC | PRN

## 2023-05-06 MED ORDER — LIDOCAINE 5 % EX PTCH
1.0000 | MEDICATED_PATCH | CUTANEOUS | Status: DC
  Administered 2023-05-06: 1 via TRANSDERMAL
  Filled 2023-05-06: qty 1
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
## 2023-05-06 NOTE — ED Notes (Signed)
Pt A&O x4, no obvious distress noted, respirations regular/unlabored. Pt verbalizes understanding of discharge instructions. Pt able to ambulate from ED independently.   
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
## 2023-07-09 ENCOUNTER — Ambulatory Visit (HOSPITAL_COMMUNITY)
Admission: EM | Admit: 2023-07-09 | Discharge: 2023-07-09 | Disposition: A | Discharge status: Home / Self Care | Payer: Commercial Managed Care - HMO | Attending: Emergency Medicine | Admitting: Emergency Medicine

## 2023-07-09 ENCOUNTER — Encounter (HOSPITAL_COMMUNITY): Payer: Self-pay

## 2023-07-09 DIAGNOSIS — J069 Acute upper respiratory infection, unspecified: Secondary | ICD-10-CM | POA: Diagnosis present

## 2023-07-09 NOTE — ED Triage Notes (Signed)
Patient here today to be tested for Covid and to get a work note. Patient states that he had a cold missed work today and had to leave early on Friday. Patient states that he is feeling better now.

## 2023-07-09 NOTE — ED Provider Notes (Signed)
[facility]    CSN: [id number] Arrival date & time: 07/09/23  1924      History   Chief Complaint Chief Complaint  Patient presents with   Letter for School/Work    HPI Omar Fernandez is a 47 y.o. male.   Patient presents to clinic requesting a letter to return to work.  After Thanksgiving he had a sore throat, nasal congestion and a mild cough.  He did miss work on Friday.  His grandkids had runny nose, thinks maybe he was exposed to something on Thanksgiving.  He has felt much better over the weekend and denies any symptoms today.  No cough, congestion or fevers.  When he was sick he was taking DayQuil.  Reports his boss is requesting a COVID test and a return to work note.    The history is provided by the patient and medical records.    Past Medical History:  Diagnosis Date   Degenerative disc disease, lumbar     There are no problems to display for this patient.   Past Surgical History:  Procedure Laterality Date   APPENDECTOMY     JOINT REPLACEMENT         Home Medications    Prior to Admission medications   Medication Sig Start Date End Date Taking? Authorizing Provider  ibuprofen (ADVIL) 200 MG tablet Take 200 mg by mouth every 6 (six) hours as needed for mild pain.    [provider]    Family History History reviewed. No pertinent family history.  Social History Social History   Tobacco Use   Smoking status: Every Day    Current packs/day: 0.50    Types: Cigarettes   Smokeless tobacco: Never  Substance Use Topics   Alcohol use: No   Drug use: No     Allergies   Patient has no known allergies.   Review of Systems Review of Systems  Per HPI   Physical Exam Triage Vital Signs ED Triage Vitals  Encounter Vitals Group     BP 07/09/23 1951 (!) 127/92     Systolic BP Percentile --      Diastolic BP Percentile --      Pulse Rate 07/09/23 1951 93     Resp 07/09/23 1951 16     Temp 07/09/23 1951 98 F  (36.7 C)     Temp Source 07/09/23 1951 Oral     SpO2 07/09/23 1951 98 %     Weight 07/09/23 1950 180 lb (81.6 kg)     Height 07/09/23 1950 5\' 11"  (1.803 m)     Head Circumference --      Peak Flow --      Pain Score 07/09/23 1950 0     Pain Loc --      Pain Education --      Exclude from Growth Chart --    No data found.  Updated Vital Signs BP (!) 127/92 (BP Location: Left Arm)   Pulse 93   Temp 98 F (36.7 C) (Oral)   Resp 16   Ht 5\' 11"  (1.803 m)   Wt 180 lb (81.6 kg)   SpO2 98%   BMI 25.10 kg/m   Visual Acuity Right Eye Distance:   Left Eye Distance:   Bilateral Distance:    Right Eye Near:   Left Eye Near:    Bilateral Near:     Physical Exam Vitals and nursing note reviewed.  Constitutional:      Appearance:  Normal appearance.  HENT:     Head: Normocephalic and atraumatic.     Right Ear: External ear normal.     Left Ear: External ear normal.     Nose: Nose normal.     Mouth/Throat:     Mouth: Mucous membranes are moist.  Eyes:     Conjunctiva/sclera: Conjunctivae normal.  Cardiovascular:     Rate and Rhythm: Normal rate.  Pulmonary:     Effort: Pulmonary effort is normal. No respiratory distress.  Musculoskeletal:        General: Normal range of motion.  Skin:    General: Skin is warm and dry.  Neurological:     General: No focal deficit present.     Mental Status: He is alert.  Psychiatric:        Mood and Affect: Mood normal.      UC Treatments / Results  Labs (all labs ordered are listed, but only abnormal results are displayed) Labs Reviewed  SARS CORONAVIRUS 2 (TAT 6-24 HRS)    EKG   Radiology No results found.  Procedures Procedures (including critical care time)  Medications Ordered in UC Medications - No data to display  Initial Impression / Assessment and Plan / UC Course  I have reviewed the triage vital signs and the nursing notes.  Pertinent labs & imaging results that were available during my care of the  patient were reviewed by me and considered in my medical decision making (see chart for details).  Vitals and triage reviewed, patient is hemodynamically stable.  Initially had viral URI symptoms, none currently.  Boss requesting COVID-19 testing, completed in clinic.  Patient is cleared to return to work.  Plan of care, follow-up care return precautions given, no questions at this time.     Final Clinical Impressions(s) / UC Diagnoses   Final diagnoses:  Viral URI with cough     Discharge Instructions      You are cleared to return to work. Return to clinic for any new symptoms.      ED Prescriptions   None    PDMP not reviewed this encounter.   Eddrick Dilone, [place] N, [place] 07/09/23 2008

## 2023-07-09 NOTE — Discharge Instructions (Signed)
You are cleared to return to work. Return to clinic for any new symptoms.

## 2023-07-10 LAB — SARS CORONAVIRUS 2 (TAT 6-24 HRS): SARS Coronavirus 2: NEGATIVE

## 2023-08-20 ENCOUNTER — Ambulatory Visit (HOSPITAL_COMMUNITY)
Admission: EM | Admit: 2023-08-20 | Discharge: 2023-08-20 | Disposition: A | Discharge status: Home / Self Care | Payer: Self-pay | Attending: Physician Assistant | Admitting: Physician Assistant

## 2023-08-20 ENCOUNTER — Encounter (HOSPITAL_COMMUNITY): Payer: Self-pay

## 2023-08-20 DIAGNOSIS — M25561 Pain in right knee: Secondary | ICD-10-CM

## 2023-08-20 DIAGNOSIS — S8391XA Sprain of unspecified site of right knee, initial encounter: Secondary | ICD-10-CM

## 2023-08-20 MED ORDER — IBUPROFEN 800 MG PO TABS: 800.0000 mg | ORAL_TABLET | Freq: Three times a day (TID) | ORAL | 0 refills | Status: DC

## 2023-08-20 NOTE — ED Triage Notes (Signed)
 Patient here today with c/o right knee pain X 4 days. 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 Patient has been taking IBU with little relief. Patient has some knee pain about 3-4 months ago and had a brace but lost it when he moved. He has been using a knee sleeve with some relief.

## 2023-08-20 NOTE — ED Provider Notes (Signed)
 [facility]    CSN: [id number] Arrival date & time: 08/20/23  1754      History   Chief Complaint Chief Complaint  Patient presents with   Knee Pain    HPI Omar Fernandez is a 48 y.o. male.   Patient presents today with a 4 to 5-day history of recurrent right knee pain.  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  4 to 5 months ago he had a similar injury and was diagnosed with a Baker's cyst at [facility].  He was using a brace but has since moved and lost his brace in the process.  He reports that earlier today pain is rated 7 but is currently 5 on a 0-10 pain scale, worse with certain activities, no alleviating factors identified.  He has tried ibuprofen  without improvement of symptoms.  Denies any numbness or paresthesias in the foot.  Denies any popping, clicking, instability.  He is able to ambulate unassisted and ascend/descend stairs without difficulty.  Denies previous surgery involving his right knee.    Past Medical History:  Diagnosis Date   Degenerative disc disease, lumbar     There are no active problems to display for this patient.   Past Surgical History:  Procedure Laterality Date   APPENDECTOMY     JOINT REPLACEMENT         Home Medications    Prior to Admission medications   Medication Sig Start Date End Date Taking? Authorizing Provider  ibuprofen  (ADVIL ) 800 MG tablet Take 1 tablet (800 mg total) by mouth 3 (three) times daily. 08/20/23  Yes Teriann Livingood, Rocky POUR, PA-C    Family History History reviewed. No pertinent family history.  Social History Social History   Tobacco Use   Smoking status: Every Day    Current packs/day: 0.50    Types: Cigarettes   Smokeless tobacco: Never  Substance Use Topics   Alcohol use: No   Drug use: No     Allergies   Patient has no known allergies.   Review of  Systems Review of Systems  Constitutional:  Positive for activity change. Negative for appetite change, fatigue and fever.  Musculoskeletal:  Positive for arthralgias. Negative for joint swelling and myalgias.  Skin:  Negative for color change and wound.  Neurological:  Negative for weakness and numbness.     Physical Exam Triage Vital Signs ED Triage Vitals  Encounter Vitals Group     BP 08/20/23 2046 (!) 142/95     Systolic BP Percentile --      Diastolic BP Percentile --      Pulse Rate 08/20/23 2046 74     Resp 08/20/23 2046 16     Temp 08/20/23 2046 98.6 F (37 C)     Temp Source 08/20/23 2046 Oral     SpO2 08/20/23 2046 100 %     Weight 08/20/23 2047 180 lb (81.6 kg)     Height 08/20/23 2047 5' 11 (1.803 m)     Head Circumference --      Peak Flow --      Pain Score 08/20/23 2047 6     Pain Loc --      Pain Education --      Exclude from Growth Chart --    No data found.  Updated Vital Signs BP (!) 142/95 (BP Location: Right Arm)  Pulse 74   Temp 98.6 F (37 C) (Oral)   Resp 16   Ht 5' 11 (1.803 m)   Wt 180 lb (81.6 kg)   SpO2 100%   BMI 25.10 kg/m   Visual Acuity Right Eye Distance:   Left Eye Distance:   Bilateral Distance:    Right Eye Near:   Left Eye Near:    Bilateral Near:     Physical Exam Vitals reviewed.  Constitutional:      General: He is awake.     Appearance: Normal appearance. He is well-developed. He is not ill-appearing.     Comments: Very pleasant male appears stated age in no acute distress sitting comfortably in exam room  HENT:     Head: Normocephalic and atraumatic.     Mouth/Throat:     Pharynx: Uvula midline. No oropharyngeal exudate or posterior oropharyngeal erythema.  Cardiovascular:     Rate and Rhythm: Normal rate and regular rhythm.     Pulses:          Posterior tibial pulses are 2+ on the right side.     Heart sounds: Normal heart sounds, S1 normal and S2 normal. No murmur heard. Pulmonary:     Effort:  Pulmonary effort is normal.     Breath sounds: Normal breath sounds. No stridor. No wheezing, rhonchi or rales.     Comments: Clear to auscultation bilaterally Musculoskeletal:     Right knee: Crepitus present. No swelling. Normal range of motion. Tenderness present over the medial joint line. No LCL laxity, MCL laxity, ACL laxity or PCL laxity.     Instability Tests: Anterior drawer test negative. Posterior drawer test negative. Medial McMurray test negative and lateral McMurray test negative.     Comments: Right knee: Tender palpation over inferior joint line without deformity.  Normal active range of motion.  No ligamentous laxity on exam.  Strength 5/5.  Crepitus noted with flexion.  Neurological:     Mental Status: He is alert.  Psychiatric:        Behavior: Behavior is cooperative.      UC Treatments / Results  Labs (all labs ordered are listed, but only abnormal results are displayed) Labs Reviewed - No data to display  EKG   Radiology No results found.  Procedures Procedures (including critical care time)  Medications Ordered in UC Medications - No data to display  Initial Impression / Assessment and Plan / UC Course  I have reviewed the triage vital signs and the nursing notes.  Pertinent labs & imaging results that were available during my care of the patient were reviewed by me and considered in my medical decision making (see chart for details).     Patient is well-appearing, afebrile, nontoxic, nontachycardic.  X-rays were deferred as he denies any recent trauma and has no focal bony tenderness.  Suspect sprain as etiology of symptoms.  He was placed in a brace for comfort and support.  Recommended RICE protocol.  Will start ibuprofen  800 mg up to 3 times a day for pain relief.  We discussed that he should not take additional NSAIDs with this medication but can use acetaminophen /Tylenol  as needed.  He is established with [facility] and strongly encouraged to  follow-up with specialist for further evaluation and management.  We discussed that if he has any worsening or changing symptoms he needs to be seen immediately including popping, clicking, instability, increasing pain, swelling.  History return precautions given.  Work excuse note provided.  Final  Clinical Impressions(s) / UC Diagnoses   Final diagnoses:  Sprain of right knee, unspecified ligament, initial encounter  Acute pain of right knee     Discharge Instructions      Keep your knee elevated.  Use the brace for comfort and support.  Use ice a few times per day for the next several days.  Take ibuprofen  for pain relief.  Do not take NSAIDs with this medication including aspirin, ibuprofen /Advil , naproxen /Aleve .  You can use acetaminophen /Tylenol  as needed.  I would recommend following up with [facility] as soon as possible.  Call them to schedule an appointment.  If anything worsens please return for reevaluation.    ED Prescriptions     Medication Sig Dispense Auth. Provider   ibuprofen  (ADVIL ) 800 MG tablet Take 1 tablet (800 mg total) by mouth 3 (three) times daily. 21 tablet Ichael Pullara K, PA-C      PDMP not reviewed this encounter.   Sherrell Rocky POUR, PA-C 08/20/23 2103

## 2023-08-20 NOTE — Discharge Instructions (Signed)
 Keep your knee elevated.  Use the brace for comfort and support.  Use ice a few times per day for the next several days.  Take ibuprofen  for pain relief.  Do not take NSAIDs with this medication including aspirin, ibuprofen /Advil , naproxen /Aleve .  You can use acetaminophen /Tylenol  as needed.  I would recommend following up with [facility] as soon as possible.  Call them to schedule an appointment.  If anything worsens please return for reevaluation.

## 2023-09-03 ENCOUNTER — Ambulatory Visit
Admission: RE | Admit: 2023-09-03 | Discharge: 2023-09-03 | Disposition: A | Discharge status: Home / Self Care | Payer: No Typology Code available for payment source | Source: Ambulatory Visit

## 2023-09-03 VITALS — BP 124/85 | HR 103 | Temp 98.8°F | Resp 14

## 2023-09-03 DIAGNOSIS — M25561 Pain in right knee: Secondary | ICD-10-CM | POA: Diagnosis not present

## 2023-09-03 NOTE — ED Triage Notes (Signed)
Pt reports right knee injury, pt states he was seen at the [facility] urgent care [place], had a follow up with [facility], was given a knee brace, needs a note for work has a follow up with [facility] next week.

## 2023-09-03 NOTE — Discharge Instructions (Signed)
Continue RICE therapy, rest, ice, compression, and elevation.  Apply ice for pain or swelling, heat for spasm or stiffness.  Apply for 20 minutes, remove for 1 hour, repeat as needed. Continue to wear the brace that you have been provided when you are engaged in prolonged or strenuous activity. Continue over-the-counter Tylenol or ibuprofen as needed for pain, fever, or general discomfort. Follow-up with [facility] as scheduled. Follow-up as needed.

## 2023-09-03 NOTE — ED Provider Notes (Signed)
[facility]    CSN: [id number] Arrival date & time: 09/03/23  1758      History   Chief Complaint Chief Complaint  Patient presents with   Knee Injury    Entered by patient    HPI Omar Fernandez is a 48 y.o. male.   The history is provided by the patient.   Patient presents for complaints of ongoing right knee pain.  Patient states he was seen at another location on 08/20/2023, diagnosed with a sprain of the right knee.  He states that he went to work today, and developed increased pain in the right knee.  He states over the past several days, he has also had worsening swelling.  States when he went to work today, his boss told him to go home, but states that he would need a note for work to be off of his knee for the next couple of days.  Patient states that he is scheduled to see [facility] next week.  Past Medical History:  Diagnosis Date   Degenerative disc disease, lumbar     There are no active problems to display for this patient.   Past Surgical History:  Procedure Laterality Date   APPENDECTOMY     JOINT REPLACEMENT         Home Medications    Prior to Admission medications   Medication Sig Start Date End Date Taking? Authorizing Provider  ibuprofen (ADVIL) 800 MG tablet Take 1 tablet (800 mg total) by mouth 3 (three) times daily. 08/20/23   Raspet, Noberto Retort, PA-C    Family History History reviewed. No pertinent family history.  Social History Social History   Tobacco Use   Smoking status: Every Day    Current packs/day: 0.50    Types: Cigarettes   Smokeless tobacco: Never  Substance Use Topics   Alcohol use: No   Drug use: No     Allergies   Patient has no known allergies.   Review of Systems Review of Systems Per HPI  Physical Exam Triage Vital Signs ED Triage Vitals  Encounter Vitals Group     BP 09/03/23 1812 124/85     Systolic BP Percentile --      Diastolic BP Percentile --      Pulse Rate 09/03/23 1812 (!) 103      Resp 09/03/23 1812 14     Temp 09/03/23 1812 98.8 F (37.1 C)     Temp Source 09/03/23 1812 Oral     SpO2 09/03/23 1812 97 %     Weight --      Height --      Head Circumference --      Peak Flow --      Pain Score 09/03/23 1813 7     Pain Loc --      Pain Education --      Exclude from Growth Chart --    No data found.  Updated Vital Signs BP 124/85 (BP Location: Right Arm)   Pulse (!) 103   Temp 98.8 F (37.1 C) (Oral)   Resp 14   SpO2 97%   Visual Acuity Right Eye Distance:   Left Eye Distance:   Bilateral Distance:    Right Eye Near:   Left Eye Near:    Bilateral Near:     Physical Exam Vitals and nursing note reviewed.  Constitutional:      General: He is not in acute distress.    Appearance: Normal appearance.  HENT:     Head: Normocephalic.  Eyes:     Extraocular Movements: Extraocular movements intact.     Pupils: Pupils are equal, round, and reactive to light.  Pulmonary:     Effort: Pulmonary effort is normal.  Musculoskeletal:     Cervical back: Normal range of motion.     Right knee: Crepitus present. No swelling, deformity or erythema. Decreased range of motion. Tenderness present. Normal pulse.     Comments: Tender palpation over inferior joint line without deformity.  No obvious erythema, deformity, bruising, or redness present.  Normal range of motion.    Skin:    General: Skin is warm and dry.  Neurological:     General: No focal deficit present.     Mental Status: He is alert and oriented to person, place, and time.  Psychiatric:        Mood and Affect: Mood normal.        Behavior: Behavior normal.      UC Treatments / Results  Labs (all labs ordered are listed, but only abnormal results are displayed) Labs Reviewed - No data to display  EKG   Radiology No results found.  Procedures Procedures (including critical care time)  Medications Ordered in UC Medications - No data to display  Initial Impression / Assessment  and Plan / UC Course  I have reviewed the triage vital signs and the nursing notes.  Pertinent labs & imaging results that were available during my care of the patient were reviewed by me and considered in my medical decision making (see chart for details).  Patient with prior diagnosis of sprain of the right knee.  Patient currently with brace to the right knee.  Patient is awaiting appointment with [facility].  In the interim, will provide patient note for work for the next 2 days.  Supportive care recommendations were provided and discussed with the patient to include RICE therapy, and over-the-counter analgesics.  Patient advised to follow-up with [facility] as scheduled.  Patient is in agreement with this plan of care and verbalizes understanding.  All questions were answered.  Patient stable for discharge.  Final Clinical Impressions(s) / UC Diagnoses   Final diagnoses:  Right knee pain, unspecified chronicity     Discharge Instructions      Continue RICE therapy, rest, ice, compression, and elevation.  Apply ice for pain or swelling, heat for spasm or stiffness.  Apply for 20 minutes, remove for 1 hour, repeat as needed. Continue to wear the brace that you have been provided when you are engaged in prolonged or strenuous activity. Continue over-the-counter Tylenol or ibuprofen as needed for pain, fever, or general discomfort. Follow-up with [facility] as scheduled. Follow-up as needed.     ED Prescriptions   None    PDMP not reviewed this encounter.   Abran Cantor, NP 09/03/23 [phone]

## 2023-10-30 ENCOUNTER — Ambulatory Visit
Admission: RE | Admit: 2023-10-30 | Discharge: 2023-10-30 | Disposition: A | Discharge status: Home / Self Care | Source: Ambulatory Visit | Attending: Nurse Practitioner | Admitting: Nurse Practitioner

## 2023-10-30 VITALS — BP 131/96 | HR 87 | Temp 98.5°F | Resp 18

## 2023-10-30 DIAGNOSIS — K047 Periapical abscess without sinus: Secondary | ICD-10-CM

## 2023-10-30 MED ORDER — KETOROLAC TROMETHAMINE 30 MG/ML IJ SOLN
30.0000 mg | Freq: Once | INTRAMUSCULAR | Status: AC
  Administered 2023-10-30: 30 mg via INTRAMUSCULAR

## 2023-10-30 MED ORDER — AMOXICILLIN-POT CLAVULANATE 875-125 MG PO TABS: 1.0000 | ORAL_TABLET | Freq: Two times a day (BID) | ORAL | 0 refills | Status: AC

## 2023-10-30 NOTE — ED Provider Notes (Signed)
 [facility]    CSN: [id number] Arrival date & time: 10/30/23  1247      History   Chief Complaint Chief Complaint  Patient presents with   Abscess    Entered by patient    HPI Omar Fernandez is a 48 y.o. male.   Patient presents today with wife for 4 to 5-day history of right lower dental pain that is rating up to the right ear.  He denies fevers, body aches or chills.  No recent cough, congestion, change in hearing, or ear drainage.  He endorses sinus pressure on the right side of his face.  Has been taking Tylenol, Aleve, and using Orajel without much improvement.  Reports history of dental abscesses.    Past Medical History:  Diagnosis Date   Degenerative disc disease, lumbar     There are no active problems to display for this patient.   Past Surgical History:  Procedure Laterality Date   APPENDECTOMY     JOINT REPLACEMENT         Home Medications    Prior to Admission medications   Medication Sig Start Date End Date Taking? Authorizing Provider  amoxicillin-clavulanate (AUGMENTIN) 875-125 MG tablet Take 1 tablet by mouth 2 (two) times daily for 7 days. 10/30/23 11/06/23 Yes Valentino Nose, NP  ibuprofen (ADVIL) 800 MG tablet Take 1 tablet (800 mg total) by mouth 3 (three) times daily. 08/20/23   Raspet, Noberto Retort, PA-C    Family History History reviewed. No pertinent family history.  Social History Social History   Tobacco Use   Smoking status: Every Day    Current packs/day: 0.50    Types: Cigarettes   Smokeless tobacco: Never  Vaping Use   Vaping status: Never Used  Substance Use Topics   Alcohol use: No   Drug use: No     Allergies   Patient has no known allergies.   Review of Systems Review of Systems Per HPI  Physical Exam Triage Vital Signs ED Triage Vitals  Encounter Vitals Group     BP 10/30/23 1307 (!) 131/96     Systolic BP Percentile --      Diastolic BP Percentile --      Pulse Rate 10/30/23 1307 87      Resp 10/30/23 1307 18     Temp 10/30/23 1307 98.5 F (36.9 C)     Temp Source 10/30/23 1307 Oral     SpO2 10/30/23 1307 98 %     Weight --      Height --      Head Circumference --      Peak Flow --      Pain Score 10/30/23 1305 7     Pain Loc --      Pain Education --      Exclude from Growth Chart --    No data found.  Updated Vital Signs BP (!) 131/96 (BP Location: Right Arm)   Pulse 87   Temp 98.5 F (36.9 C) (Oral)   Resp 18   SpO2 98%   Visual Acuity Right Eye Distance:   Left Eye Distance:   Bilateral Distance:    Right Eye Near:   Left Eye Near:    Bilateral Near:     Physical Exam Vitals and nursing note reviewed.  Constitutional:      General: He is not in acute distress.    Appearance: Normal appearance. He is not toxic-appearing.  HENT:     Head:  Normocephalic and atraumatic.     Right Ear: External ear normal.     Left Ear: External ear normal.     Nose: Nose normal. No congestion or rhinorrhea.     Mouth/Throat:     Mouth: Mucous membranes are moist.     Dentition: Abnormal dentition. Dental caries and dental abscesses present.     Pharynx: Oropharynx is clear. No pharyngeal swelling or posterior oropharyngeal erythema.     Tonsils: No tonsillar exudate.      Comments: Developing dental abscess noted to tooth as marked; there is significant tenderness to touch with a cotton tip swab.  No active drainage. Eyes:     General: No scleral icterus.    Extraocular Movements: Extraocular movements intact.  Pulmonary:     Effort: Pulmonary effort is normal. No respiratory distress.  Musculoskeletal:     Cervical back: Normal range of motion.  Lymphadenopathy:     Cervical: No cervical adenopathy.  Skin:    General: Skin is warm and dry.     Coloration: Skin is not jaundiced or pale.     Findings: No erythema.  Neurological:     Mental Status: He is alert and oriented to person, place, and time.  Psychiatric:        Behavior: Behavior is  cooperative.      UC Treatments / Results  Labs (all labs ordered are listed, but only abnormal results are displayed) Labs Reviewed - No data to display  EKG   Radiology No results found.  Procedures Procedures (including critical care time)  Medications Ordered in UC Medications  ketorolac (TORADOL) 30 MG/ML injection 30 mg (30 mg Intramuscular Given 10/30/23 1321)    Initial Impression / Assessment and Plan / UC Course  I have reviewed the triage vital signs and the nursing notes.  Pertinent labs & imaging results that were available during my care of the patient were reviewed by me and considered in my medical decision making (see chart for details).   .Patient is well-appearing, normotensive, afebrile, not tachycardic, not tachypneic, oxygenating well on room air.   1. Dental abscess Treat with Augmentin twice daily for 7 days Pain treated with Toradol 30 mg IM in urgent care today, avoid NSAIDs for 48 hours and continue Tylenol, Orajel Return and ER precautions discussed Dental resource guide given today Work excuse provided  The patient was given the opportunity to ask questions.  All questions answered to their satisfaction.  The patient is in agreement to this plan.   Final Clinical Impressions(s) / UC Diagnoses   Final diagnoses:  Dental abscess     Discharge Instructions      You are being treated today for a dental abscess with Augmentin.  Take this twice daily for 7 days to treat the infection.  We gave you an injection of Toradol today which is an anti-inflammatory medicine, avoid Advil, ibuprofen, Motrin, and over-the-counter NSAIDs until Thursday.  You can continue oral gel and Tylenol 500 to 1000 mg every 6 hours for pain.  Recommend close follow-up with dentist; contact information has been provided for local dentists in the area.    ED Prescriptions     Medication Sig Dispense Auth. Provider   amoxicillin-clavulanate (AUGMENTIN) 875-125 MG  tablet Take 1 tablet by mouth 2 (two) times daily for 7 days. 14 tablet Valentino Nose, NP      PDMP not reviewed this encounter.   Valentino Nose, NP 10/30/23 1322

## 2023-10-30 NOTE — ED Triage Notes (Signed)
 Pt reports he has some right side face pain x 5 days Thinking it is his tooth or a sinus infection he took tylenol and aleve

## 2023-10-30 NOTE — Discharge Instructions (Addendum)
 You are being treated today for a dental abscess with Augmentin.  Take this twice daily for 7 days to treat the infection.  We gave you an injection of Toradol today which is an anti-inflammatory medicine, avoid Advil, ibuprofen, Motrin, and over-the-counter NSAIDs until Thursday.  You can continue oral gel and Tylenol 500 to 1000 mg every 6 hours for pain.  Recommend close follow-up with dentist; contact information has been provided for local dentists in the area.

## 2023-12-03 ENCOUNTER — Encounter (HOSPITAL_COMMUNITY): Payer: Self-pay

## 2023-12-03 ENCOUNTER — Emergency Department (HOSPITAL_COMMUNITY)

## 2023-12-03 ENCOUNTER — Other Ambulatory Visit: Payer: Self-pay

## 2023-12-03 ENCOUNTER — Emergency Department (HOSPITAL_COMMUNITY)
Admission: EM | Admit: 2023-12-03 | Discharge: 2023-12-03 | Disposition: A | Discharge status: Home / Self Care | Attending: Emergency Medicine | Admitting: Emergency Medicine

## 2023-12-03 DIAGNOSIS — R079 Chest pain, unspecified: Secondary | ICD-10-CM

## 2023-12-03 DIAGNOSIS — Z87891 Personal history of nicotine dependence: Secondary | ICD-10-CM | POA: Diagnosis not present

## 2023-12-03 DIAGNOSIS — R0789 Other chest pain: Secondary | ICD-10-CM | POA: Insufficient documentation

## 2023-12-03 LAB — COMPREHENSIVE METABOLIC PANEL WITH GFR
ALT: 13 U/L (ref 0–44)
AST: 20 U/L (ref 15–41)
Albumin: 3.9 g/dL (ref 3.5–5.0)
Alkaline Phosphatase: 47 U/L (ref 38–126)
Anion gap: 12 (ref 5–15)
BUN: 17 mg/dL (ref 6–20)
CO2: 21 mmol/L — ABNORMAL LOW (ref 22–32)
Calcium: 9.6 mg/dL (ref 8.9–10.3)
Chloride: 107 mmol/L (ref 98–111)
Creatinine, Ser: 0.92 mg/dL (ref 0.61–1.24)
GFR, Estimated: >60 mL/min (ref >60)
Glucose, Bld: 124 mg/dL — ABNORMAL HIGH (ref 70–99)
Potassium: 3.5 mmol/L (ref 3.5–5.1)
Sodium: 140 mmol/L (ref 135–145)
Total Bilirubin: 1 mg/dL (ref 0.0–1.2)
Total Protein: 7.5 g/dL (ref 6.5–8.1)

## 2023-12-03 LAB — CBC
HCT: 47.8 % (ref 39.0–52.0)
Hemoglobin: 16.2 g/dL (ref 13.0–17.0)
MCH: 29.7 pg (ref 26.0–34.0)
MCHC: 33.9 g/dL (ref 30.0–36.0)
MCV: 87.5 fL (ref 80.0–100.0)
Platelets: 275 10*3/uL (ref 150–400)
RBC: 5.46 MIL/uL (ref 4.22–5.81)
RDW: 13.2 % (ref 11.5–15.5)
WBC: 7.2 10*3/uL (ref 4.0–10.5)
nRBC: 0 % (ref 0.0–0.2)

## 2023-12-03 LAB — D-DIMER, QUANTITATIVE: D-Dimer, Quant: <0.27 ug{FEU}/mL (ref 0.00–0.50)

## 2023-12-03 LAB — LIPASE, BLOOD: Lipase: 37 U/L (ref 11–51)

## 2023-12-03 LAB — CBG MONITORING, ED: Glucose-Capillary: 114 mg/dL — ABNORMAL HIGH (ref 70–99)

## 2023-12-03 LAB — TROPONIN I (HIGH SENSITIVITY)
Troponin I (High Sensitivity): 5 ng/L (ref <18)
Troponin I (High Sensitivity): 4 ng/L (ref <18)

## 2023-12-03 MED ORDER — ALUM & MAG HYDROXIDE-SIMETH 200-200-20 MG/5ML PO SUSP
30.0000 mL | Freq: Once | ORAL | Status: AC
  Administered 2023-12-03: 30 mL via ORAL
  Filled 2023-12-03: qty 30

## 2023-12-03 NOTE — ED Triage Notes (Signed)
 Reports pain in chest and into left arm and feels as though his heart is skipping a beat. " I think I'm having a heart attack".  +sweating and sob.

## 2023-12-03 NOTE — Discharge Instructions (Signed)
Try pepcid or tagamet up to twice a day.  Try to avoid things that may make this worse, most commonly these are spicy foods tomato based products fatty foods chocolate and peppermint.  Alcohol and tobacco can also make this worse.  Return to the emergency department for sudden worsening pain fever or inability to eat or drink.  

## 2023-12-03 NOTE — ED Provider Notes (Addendum)
Omar Fernandez   CSN: [id number] Arrival date & time: 12/03/23  0754     History  Chief Complaint  Patient presents with   Chest Pain    Omar L Abdimalik Mountford. is a 48 y.o. male.  48 yo M with chief complaint of chest pain.  Going on since last night.  Seems to come and go.  Sharp feels sweaty with it and short of breath.  History of smoking.  Otherwise denies significant past medical history.  No history of PE or DVT and no history of MI.  No family history of MI.   Chest Pain      Home Medications Prior to Admission medications   Medication Sig Start Date End Date Taking? Authorizing Provider  ibuprofen  (ADVIL ) 800 MG tablet Take 1 tablet (800 mg total) by mouth 3 (three) times daily. Patient not taking: Reported on 12/03/2023 08/20/23   Raspet, Erin K, PA-C      Allergies    Patient has no known allergies.    Review of Systems   Review of Systems  Cardiovascular:  Positive for chest pain.    Physical Exam Updated Vital Signs BP (!) 117/94   Pulse 77   Temp 98.6 F (37 C) (Oral)   Resp (!) 23   Ht 5\' 11"  (1.803 m)   Wt 81.6 kg   SpO2 99%   BMI 25.10 kg/m  Physical Exam Vitals and nursing Fernandez reviewed.  Constitutional:      Appearance: He is well-developed.  HENT:     Head: Normocephalic and atraumatic.  Eyes:     Pupils: Pupils are equal, round, and reactive to light.  Neck:     Vascular: No JVD.  Cardiovascular:     Rate and Rhythm: Normal rate and regular rhythm.     Heart sounds: No murmur heard.    No friction rub. No gallop.  Pulmonary:     Effort: No respiratory distress.     Breath sounds: No wheezing.  Abdominal:     General: There is no distension.     Tenderness: There is no abdominal tenderness. There is no guarding or rebound.  Musculoskeletal:        General: Normal range of motion.     Cervical back: Normal range of motion and neck supple.  Skin:    Coloration: Skin is not  pale.     Findings: No rash.  Neurological:     Mental Status: He is alert and oriented to person, place, and time.  Psychiatric:        Behavior: Behavior normal.     ED Results / Procedures / Treatments   Labs (all labs ordered are listed, but only abnormal results are displayed) Labs Reviewed  COMPREHENSIVE METABOLIC PANEL WITH GFR - Abnormal; Notable for the following components:      Result Value   CO2 21 (*)    Glucose, Bld 124 (*)    All other components within normal limits  CBG MONITORING, ED - Abnormal; Notable for the following components:   Glucose-Capillary 114 (*)    All other components within normal limits  CBC  LIPASE, BLOOD  D-DIMER, QUANTITATIVE  TROPONIN I (HIGH SENSITIVITY)  TROPONIN I (HIGH SENSITIVITY)    EKG EKG Interpretation Date/Time:  Monday December 03 2023 08:03:47 EDT Ventricular Rate:  106 PR Interval:  142 QRS Duration:  96 QT Interval:  333 QTC Calculation: 443 R Axis:   22  Text Interpretation: Sinus tachycardia Baseline wander in lead(s) I III aVL perhaps peaked t waves Otherwise no significant change Confirmed by Albertus Hughs [phone]) on 12/03/2023 8:28:59 AM  Radiology DG Chest Portable 1 View Result Date: 12/03/2023 CLINICAL DATA:  Left chest pain with irregular heartbeat, left elbow aching and diaphoresis. EXAM: PORTABLE CHEST 1 VIEW COMPARISON:  Radiographs 05/06/2023 and 12/23/2018. FINDINGS: 0914 hours. Two views submitted. The heart size and mediastinal contours are normal. Mild linear atelectasis or scarring at the left lung base. The lungs are otherwise clear, without edema or confluent airspace disease. There is no pleural effusion or pneumothorax. Mild degenerative changes in the spine without acute osseous abnormality. Telemetry leads overlie the chest. IMPRESSION: No evidence of active cardiopulmonary process. Electronically Signed   By: Elmon Hagedorn M.D.   On: 12/03/2023 09:35    Procedures .1-3 Lead EKG  Interpretation  Performed by: Albertus Hughs, DO Authorized by: Albertus Hughs, DO     Interpretation: normal     ECG rate:  72   ECG rate assessment: normal     Rhythm: sinus rhythm     Ectopy: PVCs     Conduction: normal       Medications Ordered in ED Medications  alum & mag hydroxide-simeth (MAALOX/MYLANTA) 200-200-20 MG/5ML suspension 30 mL (30 mLs Oral Given 12/03/23 8295)    ED Course/ Medical Decision Making/ A&P                                 Medical Decision Making Amount and/or Complexity of Data Reviewed Labs: ordered. Radiology: ordered.  Risk OTC drugs.   48 yo M with a chief complaint of chest pain.  Atypical in nature going on since yesterday.  2 troponins here are negative.  Was tachycardic on arrival D-dimer was negative as well.  Chest x-ray independently interpreted by me without focal infiltrate or pneumothorax.  I discussed results with the patient and family.  They are worried about palpitations as well.  Some PVCs on telemetry.  Will try a magnesium supplement.  Patient did have some improvements with GI cocktail.  Trial H2 blockers.   12:09 PM:  I have discussed the diagnosis/risks/treatment options with the patient.  Evaluation and diagnostic testing in the emergency department does not suggest an emergent condition requiring admission or immediate intervention beyond what has been performed at this time.  They will follow up with PCP. We also discussed returning to the ED immediately if new or worsening sx occur. We discussed the sx which are most concerning (e.g., sudden worsening pain, fever, inability to tolerate by mouth) that necessitate immediate return. Medications administered to the patient during their visit and any new prescriptions provided to the patient are listed below.  Medications given during this visit Medications  alum & mag hydroxide-simeth (MAALOX/MYLANTA) 200-200-20 MG/5ML suspension 30 mL (30 mLs Oral Given 12/03/23 6213)     The  patient appears reasonably screen and/or stabilized for discharge and I doubt any other medical condition or other [facility] requiring further screening, evaluation, or treatment in the ED at this time prior to discharge.            Final Clinical Impression(s) / ED Diagnoses Final diagnoses:  Nonspecific chest pain    Rx / DC Orders ED Discharge Orders     None         Albertus Hughs, DO 12/03/23 1209    Albertus Hughs, DO 12/03/23  1209  

## 2024-01-15 ENCOUNTER — Other Ambulatory Visit: Payer: Self-pay

## 2024-01-15 ENCOUNTER — Emergency Department (HOSPITAL_COMMUNITY)
Admission: EM | Admit: 2024-01-15 | Discharge: 2024-01-15 | Disposition: A | Discharge status: Home / Self Care | Payer: Self-pay | Attending: Emergency Medicine | Admitting: Emergency Medicine

## 2024-01-15 ENCOUNTER — Emergency Department (HOSPITAL_COMMUNITY): Payer: Self-pay

## 2024-01-15 ENCOUNTER — Encounter (HOSPITAL_COMMUNITY): Payer: Self-pay

## 2024-01-15 DIAGNOSIS — Y92410 Unspecified street and highway as the place of occurrence of the external cause: Secondary | ICD-10-CM | POA: Insufficient documentation

## 2024-01-15 DIAGNOSIS — Z5321 Procedure and treatment not carried out due to patient leaving prior to being seen by health care provider: Secondary | ICD-10-CM | POA: Insufficient documentation

## 2024-01-15 DIAGNOSIS — W1789XA Other fall from one level to another, initial encounter: Secondary | ICD-10-CM | POA: Insufficient documentation

## 2024-01-15 DIAGNOSIS — R519 Headache, unspecified: Secondary | ICD-10-CM | POA: Insufficient documentation

## 2024-01-15 NOTE — ED Notes (Signed)
Pt ambulated to the bathroom unassisted.  

## 2024-01-15 NOTE — ED Notes (Addendum)
 Pt states no LOC however pt has experienced nausea/vomiting, headaches and vision changes. These are intermittent and progressively getting worse. Pupils PERRLA

## 2024-01-15 NOTE — ED Triage Notes (Signed)
 Pt states that on Thursday night he fell from his truck and fell appox 5 feet and back of head hit concrete.Pt states that he has had bad headache since. No blood thinners.

## 2024-01-15 NOTE — ED Notes (Signed)
 This RN came back from lab and noticed pt's door open. Pt nor family member in sight. CT present to take pt to CT. Another staff member went to look outside for pt. Moved pt off the board but will not dc, at this time, in the event pt returns.

## 2024-01-17 NOTE — ED Provider Notes (Signed)
 Patient eloped from the emergency department prior to my evaluation and receiving his head CT   Ninetta Basket, MD 01/17/24 1557

## 2024-02-11 ENCOUNTER — Other Ambulatory Visit: Payer: Self-pay

## 2024-02-11 ENCOUNTER — Emergency Department (HOSPITAL_BASED_OUTPATIENT_CLINIC_OR_DEPARTMENT_OTHER)
Admission: EM | Admit: 2024-02-11 | Discharge: 2024-02-11 | Disposition: A | Discharge status: Home / Self Care | Attending: Student | Admitting: Student

## 2024-02-11 ENCOUNTER — Encounter (HOSPITAL_BASED_OUTPATIENT_CLINIC_OR_DEPARTMENT_OTHER): Payer: Self-pay

## 2024-02-11 DIAGNOSIS — Y939 Activity, unspecified: Secondary | ICD-10-CM | POA: Insufficient documentation

## 2024-02-11 DIAGNOSIS — L0390 Cellulitis, unspecified: Secondary | ICD-10-CM

## 2024-02-11 DIAGNOSIS — M25522 Pain in left elbow: Secondary | ICD-10-CM | POA: Diagnosis present

## 2024-02-11 DIAGNOSIS — D72829 Elevated white blood cell count, unspecified: Secondary | ICD-10-CM | POA: Insufficient documentation

## 2024-02-11 DIAGNOSIS — L03114 Cellulitis of left upper limb: Secondary | ICD-10-CM | POA: Insufficient documentation

## 2024-02-11 DIAGNOSIS — M7022 Olecranon bursitis, left elbow: Secondary | ICD-10-CM | POA: Diagnosis not present

## 2024-02-11 DIAGNOSIS — F17210 Nicotine dependence, cigarettes, uncomplicated: Secondary | ICD-10-CM | POA: Insufficient documentation

## 2024-02-11 LAB — COMPREHENSIVE METABOLIC PANEL WITH GFR
ALT: 9 U/L (ref 0–44)
AST: 17 U/L (ref 15–41)
Albumin: 4.4 g/dL (ref 3.5–5.0)
Alkaline Phosphatase: 67 U/L (ref 38–126)
Anion gap: 14 (ref 5–15)
BUN: 13 mg/dL (ref 6–20)
CO2: 21 mmol/L — ABNORMAL LOW (ref 22–32)
Calcium: 10 mg/dL (ref 8.9–10.3)
Chloride: 108 mmol/L (ref 98–111)
Creatinine, Ser: 0.85 mg/dL (ref 0.61–1.24)
GFR, Estimated: >60 mL/min (ref >60)
Glucose, Bld: 108 mg/dL — ABNORMAL HIGH (ref 70–99)
Potassium: 3.5 mmol/L (ref 3.5–5.1)
Sodium: 142 mmol/L (ref 135–145)
Total Bilirubin: 0.5 mg/dL (ref 0.0–1.2)
Total Protein: 8.1 g/dL (ref 6.5–8.1)

## 2024-02-11 LAB — CBC WITH DIFFERENTIAL/PLATELET
Abs Immature Granulocytes: 0.04 K/uL (ref 0.00–0.07)
Basophils Absolute: 0 K/uL (ref 0.0–0.1)
Basophils Relative: 0 %
Eosinophils Absolute: 0.1 K/uL (ref 0.0–0.5)
Eosinophils Relative: 0 %
HCT: 46 % (ref 39.0–52.0)
Hemoglobin: 15.7 g/dL (ref 13.0–17.0)
Immature Granulocytes: 0 %
Lymphocytes Relative: 20 %
Lymphs Abs: 2.3 K/uL (ref 0.7–4.0)
MCH: 30 pg (ref 26.0–34.0)
MCHC: 34.1 g/dL (ref 30.0–36.0)
MCV: 87.8 fL (ref 80.0–100.0)
Monocytes Absolute: 0.8 K/uL (ref 0.1–1.0)
Monocytes Relative: 7 %
Neutro Abs: 8.3 K/uL — ABNORMAL HIGH (ref 1.7–7.7)
Neutrophils Relative %: 73 %
Platelets: 281 K/uL (ref 150–400)
RBC: 5.24 MIL/uL (ref 4.22–5.81)
RDW: 13.2 % (ref 11.5–15.5)
WBC: 11.5 K/uL — ABNORMAL HIGH (ref 4.0–10.5)
nRBC: 0 % (ref 0.0–0.2)

## 2024-02-11 MED ORDER — NAPROXEN 250 MG PO TABS
500.0000 mg | ORAL_TABLET | Freq: Once | ORAL | Status: AC
  Administered 2024-02-11: 500 mg via ORAL
  Filled 2024-02-11: qty 2

## 2024-02-11 MED ORDER — CEFADROXIL 500 MG PO CAPS: 500.0000 mg | ORAL_CAPSULE | Freq: Two times a day (BID) | ORAL | 0 refills | Status: AC

## 2024-02-11 MED ORDER — NAPROXEN 375 MG PO TABS: 375.0000 mg | ORAL_TABLET | Freq: Two times a day (BID) | ORAL | 0 refills | Status: AC

## 2024-02-11 MED ORDER — CEPHALEXIN 250 MG PO CAPS
500.0000 mg | ORAL_CAPSULE | Freq: Once | ORAL | Status: AC
  Administered 2024-02-11: 500 mg via ORAL
  Filled 2024-02-11: qty 2

## 2024-02-11 MED ORDER — CEFADROXIL 500 MG PO CAPS: 500.0000 mg | ORAL_CAPSULE | Freq: Once | ORAL | Status: DC

## 2024-02-11 NOTE — ED Triage Notes (Signed)
 Pt c/o L elbow injury approx 71mo ago, states that he busted it open & then Friday noticed some oozing. Pain, swelling, redness to area, streaking down forearm.

## 2024-02-11 NOTE — ED Notes (Signed)
 L elbow wrapped with xeroform and kerlex

## 2024-02-11 NOTE — ED Notes (Signed)
 RN reviewed discharge instructions with pt. Pt verbalized understanding and had no further questions. VSS upon discharge.

## 2024-02-12 NOTE — ED Provider Notes (Signed)
 [facility] Provider Note  CSN: [id number] Arrival date & time: 02/11/24 1548  Chief Complaint(s) Elbow Pain (L)  HPI Omar Fernandez. is a 48 y.o. male who presents emergency room for evaluation of left elbow pain and swelling.  States that he suffered an elbow injury approximately 1 month ago but started to get some redness and swelling.  States that this swelling opened 4 days ago and he started to notice some drainage and associated redness.  States that he has been using peroxide on the area but erythema is improving.  Arrives with full range of motion of the elbow.  Denies fever, chest pain, shortness of breath, abdominal pain, nausea, vomiting or other systemic symptoms.   Past Medical History Past Medical History:  Diagnosis Date   Degenerative disc disease, lumbar    There are no active problems to display for this patient.  Home Medication(s) Prior to Admission medications   Medication Sig Start Date End Date Taking? Authorizing Provider  cefadroxil  (DURICEF) 500 MG capsule Take 1 capsule (500 mg total) by mouth 2 (two) times daily for 7 days. 02/11/24 02/18/24 Yes Daril Warga, MD  naproxen  (NAPROSYN ) 375 MG tablet Take 1 tablet (375 mg total) by mouth 2 (two) times daily. 02/11/24  Yes Nilda Keathley, MD                                                                                                                                    Past Surgical History Past Surgical History:  Procedure Laterality Date   APPENDECTOMY     JOINT REPLACEMENT     Family History History reviewed. No pertinent family history.  Social History Social History   Tobacco Use   Smoking status: Every Day    Current packs/day: 0.50    Types: Cigarettes   Smokeless tobacco: Never  Vaping Use   Vaping status: Never Used  Substance Use Topics   Alcohol use: No   Drug use: No   Allergies Patient has no known allergies.  Review of  Systems Review of Systems  Skin:  Positive for rash and wound.    Physical Exam Vital Signs  I have reviewed the triage vital signs BP (!) 135/101   Pulse 97   Temp 98.9 F (37.2 C)   Resp 16   SpO2 98%   Physical Exam Constitutional:      General: He is not in acute distress.    Appearance: Normal appearance.  HENT:     Head: Normocephalic and atraumatic.     Nose: No congestion or rhinorrhea.  Eyes:     General:        Right eye: No discharge.        Left eye: No discharge.     Extraocular Movements: Extraocular movements intact.     Pupils: Pupils are equal, round, and reactive to light.  Cardiovascular:  Rate and Rhythm: Normal rate and regular rhythm.     Heart sounds: No murmur heard. Pulmonary:     Effort: No respiratory distress.     Breath sounds: No wheezing or rales.  Abdominal:     General: There is no distension.     Tenderness: There is no abdominal tenderness.  Musculoskeletal:        General: Normal range of motion.     Cervical back: Normal range of motion.  Skin:    General: Skin is warm and dry.     Findings: Lesion and rash present.  Neurological:     General: No focal deficit present.     Mental Status: He is alert.     ED Results and Treatments Labs (all labs ordered are listed, but only abnormal results are displayed) Labs Reviewed  COMPREHENSIVE METABOLIC PANEL WITH GFR - Abnormal; Notable for the following components:      Result Value   CO2 21 (*)    Glucose, Bld 108 (*)    All other components within normal limits  CBC WITH DIFFERENTIAL/PLATELET - Abnormal; Notable for the following components:   WBC 11.5 (*)    Neutro Abs 8.3 (*)    All other components within normal limits                                                                                                                          Radiology No results found.  Pertinent labs & imaging results that were available during my care of the patient were reviewed by  me and considered in my medical decision making (see MDM for details).  Medications Ordered in ED Medications  naproxen  (NAPROSYN ) tablet 500 mg (500 mg Oral Given 02/11/24 1823)  cephALEXin  (KEFLEX ) capsule 500 mg (500 mg Oral Given 02/11/24 1823)                                                                                                                                     Procedures Procedures  (including critical care time)  Medical Decision Making / ED Course   This patient presents to the ED for concern of elbow pain and swelling, this involves an extensive number of treatment options, and is a complaint that carries with it a high risk of complications and morbidity.  The differential diagnosis includes cellulitis, abscess, olecranon bursitis, erythema migrans  MDM: Patient  seen emergency room for evaluation of elbow pain and swelling.  Physical exam with a 3 cm circumferential area of erythema around the olecranon with an open wound draining serous fluid.  Patient has full range of motion of the elbow and I have fairly low suspicion for septic joint.  Laboratory evaluation with a leukocytosis to 11.5 but is otherwise unremarkable.  Suspect cellulitis versus olecranon bursitis and will treat with both antibiotics and anti-inflammatories.  Cellulitis marked with a skin marker and will be charged under staff.  At this time he does not meet inpatient criteria for admission and will be discharged with outpatient follow-up.   Additional history obtained: -Additional history obtained from wife -External records from outside source obtained and reviewed including: Chart review including previous notes, labs, imaging, consultation notes   Lab Tests: -I ordered, reviewed, and interpreted labs.   The pertinent results include:   Labs Reviewed  COMPREHENSIVE METABOLIC PANEL WITH GFR - Abnormal; Notable for the following components:      Result Value   CO2 21 (*)    Glucose, Bld 108 (*)     All other components within normal limits  CBC WITH DIFFERENTIAL/PLATELET - Abnormal; Notable for the following components:   WBC 11.5 (*)    Neutro Abs 8.3 (*)    All other components within normal limits       Medicines ordered and prescription drug management: Meds ordered this encounter  Medications   DISCONTD: cefadroxil  (DURICEF) capsule 500 mg   naproxen  (NAPROSYN ) tablet 500 mg   cefadroxil  (DURICEF) 500 MG capsule    Sig: Take 1 capsule (500 mg total) by mouth 2 (two) times daily for 7 days.    Dispense:  14 capsule    Refill:  0   naproxen  (NAPROSYN ) 375 MG tablet    Sig: Take 1 tablet (375 mg total) by mouth 2 (two) times daily.    Dispense:  20 tablet    Refill:  0   cephALEXin  (KEFLEX ) capsule 500 mg    -I have reviewed the patients home medicines and have made adjustments as needed  Critical interventions none   Social Determinants of Health:  Factors impacting patients care include: none   Reevaluation: After the interventions noted above, I reevaluated the patient and found that they have :improved  Co morbidities that complicate the patient evaluation  Past Medical History:  Diagnosis Date   Degenerative disc disease, lumbar       Dispostion: I considered admission for this patient, but at this time he does not meet inpatient criteria for admission and will be discharged with outpatient follow-up     Final Clinical Impression(s) / ED Diagnoses Final diagnoses:  Cellulitis, unspecified cellulitis site  Olecranon bursitis of left elbow     @PCDICTATION @    Albertina Dixon, MD 02/12/24 [phone]

## 2024-08-12 ENCOUNTER — Encounter (HOSPITAL_COMMUNITY): Payer: Self-pay | Admitting: *Deleted

## 2024-08-12 ENCOUNTER — Ambulatory Visit (HOSPITAL_COMMUNITY)
Admission: EM | Admit: 2024-08-12 | Discharge: 2024-08-12 | Disposition: A | Discharge status: Home / Self Care | Payer: Self-pay | Attending: Family Medicine | Admitting: Family Medicine

## 2024-08-12 ENCOUNTER — Other Ambulatory Visit: Payer: Self-pay

## 2024-08-12 DIAGNOSIS — S61217A Laceration without foreign body of left little finger without damage to nail, initial encounter: Secondary | ICD-10-CM

## 2024-08-12 DIAGNOSIS — Z23 Encounter for immunization: Secondary | ICD-10-CM

## 2024-08-12 MED ORDER — LIDOCAINE HCL 2 % IJ SOLN
INTRAMUSCULAR | Status: AC
  Filled 2024-08-12: qty 20

## 2024-08-12 MED ORDER — TETANUS-DIPHTH-ACELL PERTUSSIS 5-2-15.5 LF-MCG/0.5 IM SUSP
INTRAMUSCULAR | Status: AC
  Filled 2024-08-12: qty 0.5

## 2024-08-12 MED ORDER — TETANUS-DIPHTH-ACELL PERTUSSIS 5-2-15.5 LF-MCG/0.5 IM SUSP
0.5000 mL | Freq: Once | INTRAMUSCULAR | Status: AC
  Administered 2024-08-12: 0.5 mL via INTRAMUSCULAR

## 2024-08-12 NOTE — ED Provider Notes (Signed)
 " [facility]    CSN: [id number] Arrival date & time: 08/12/24  1330      History   Chief Complaint Chief Complaint  Patient presents with   Laceration    HPI Omar Seats. is a 49 y.o. male.   Cut his little finger on piece of metal. 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  Laceration Location:  Finger Finger laceration location:  L little finger Depth:  Cutaneous Time since incident:  4 hours Laceration mechanism:  Metal edge Pain details:    Quality:  Sharp   Severity:  Mild   Timing:  Constant   Progression:  Unchanged Foreign body present:  No foreign bodies Worsened by:  Movement   Past Medical History:  Diagnosis Date   Degenerative disc disease, lumbar     There are no active problems to display for this patient.   Past Surgical History:  Procedure Laterality Date   APPENDECTOMY     JOINT REPLACEMENT         Home Medications    Prior to Admission medications  Medication Sig Start Date End Date Taking? Authorizing Provider  naproxen  (NAPROSYN ) 375 MG tablet Take 1 tablet (375 mg total) by mouth 2 (two) times daily. 02/11/24   Kommor, Lum, MD    Family History History reviewed. No pertinent family history.  Social History Social History[1]   Allergies   Patient has no known allergies.   Review of Systems Review of Systems  Constitutional:        Pertinent ROS in HPI     Physical Exam Triage Vital Signs ED Triage Vitals  Encounter Vitals Group     BP --      Girls Systolic BP Percentile --      Girls Diastolic BP Percentile --      Boys Systolic BP Percentile --      Boys Diastolic BP Percentile --      Pulse Rate 08/12/24 1418 74     Resp 08/12/24 1418 18     Temp 08/12/24 1418 98.2 F (36.8 C)     Temp src --      SpO2 08/12/24 1418 97 %     Weight --      Height --       Head Circumference --      Peak Flow --      Pain Score 08/12/24 1417 6     Pain Loc --      Pain Education --      Exclude from Growth Chart --    No data found.  Updated Vital Signs Pulse 74   Temp 98.2 F (36.8 C)   Resp 18   SpO2 97%   Visual Acuity Right Eye Distance:   Left Eye Distance:   Bilateral Distance:    Right Eye Near:   Left Eye Near:    Bilateral Near:     Physical Exam Vitals and nursing note reviewed.  Constitutional:      General: He is not in acute distress.    Appearance: He is well-developed.  HENT:     Head: Normocephalic and atraumatic.  Eyes:     Conjunctiva/sclera: Conjunctivae normal.  Cardiovascular:     Rate and Rhythm: Normal rate.  Pulmonary:     Effort: Pulmonary effort is  normal. No respiratory distress.  Abdominal:     Palpations: Abdomen is soft.  Musculoskeletal:        General: Signs of injury present.     Right hand: Laceration present.       Arms:     Cervical back: Neck supple.     Comments: 1cm laceration over the distal lateral aspect of left 5th digit just distal to the DIP. Full active flexion and extension of the left 5th DIP Cap refill <2s  Skin:    General: Skin is warm and dry.     Capillary Refill: Capillary refill takes less than 2 seconds.  Neurological:     Mental Status: He is alert.  Psychiatric:        Mood and Affect: Mood normal.      UC Treatments / Results  Labs (all labs ordered are listed, but only abnormal results are displayed) Labs Reviewed - No data to display  EKG   Radiology No results found.  Procedures Laceration Repair: L small finger  Date/Time: 08/12/2024 4:39 PM  Performed by: Alba Sharper, MD Authorized by: Rolinda Rogue, MD   Consent:    Consent obtained:  Verbal   Consent given by:  Patient   Risks, benefits, and alternatives were discussed: yes     Risks discussed:  Infection and pain   Alternatives discussed:  No treatment Universal protocol:     Procedure explained and questions answered to patient or proxy's satisfaction: yes     Immediately prior to procedure, a time out was called: yes     Patient identity confirmed:  Verbally with patient Anesthesia:    Anesthesia method:  Topical application and local infiltration   Local anesthetic:  Lidocaine  2% w/o epi Laceration details:    Location:  Finger   Finger location:  L small finger   Length (cm):  1   Depth (mm):  1 Pre-procedure details:    Preparation:  Patient was prepped and draped in usual sterile fashion Treatment:    Area cleansed with:  Chlorhexidine   Amount of cleaning:  Standard   Debridement:  Minimal   Scar revision: no   Skin repair:    Repair method:  Sutures   Suture size:  3-0 and 4-0   Suture material:  Prolene   Number of sutures:  2 Approximation:    Approximation:  Close Repair type:    Repair type:  Simple Post-procedure details:    Procedure completion:  Tolerated well, no immediate complications  (including critical care time)  Medications Ordered in UC Medications  Tdap (ADACEL ) injection 0.5 mL (0.5 mLs Intramuscular Given 08/12/24 1438)    Initial Impression / Assessment and Plan / UC Course  I have reviewed the triage vital signs and the nursing notes.  Pertinent labs & imaging results that were available during my care of the patient were reviewed by me and considered in my medical decision making (see chart for details).    Small little finger laceration just past the DIP causing slightly increased area of tension.  2 stitches placed as below.  Counseled regarding red flag precautions for infection or further bleeding.  Patient received Tdap vaccine today. Final Clinical Impressions(s) / UC Diagnoses   Final diagnoses:  Laceration of left little finger without foreign body without damage to nail, initial encounter     Discharge Instructions      Your stitches will need to come out in 10-14 days. Please follow-up with you  regular  doctor for this. You may take Tylenol  and Ibuprofen  for pain. If you notice and redness swelling or fevers please seek care immediately.     ED Prescriptions   None    PDMP not reviewed this encounter.     [1]  Social History Tobacco Use   Smoking status: Every Day    Current packs/day: 0.50    Types: Cigarettes   Smokeless tobacco: Never  Vaping Use   Vaping status: Never Used  Substance Use Topics   Alcohol use: No   Drug use: No     Alba Sharper, MD 08/12/24 1711  "

## 2024-08-12 NOTE — ED Notes (Signed)
 LT small finger soaked in Hibclens

## 2024-08-12 NOTE — ED Triage Notes (Signed)
 PT has a lac to Lt small finger on a car while working on [employer] system.today. Pt reports he will need a tetanus vaccine.

## 2024-08-12 NOTE — Discharge Instructions (Signed)
 Your stitches will need to come out in 10-14 days. Please follow-up with you regular doctor for this. You may take Tylenol  and Ibuprofen  for pain. If you notice and redness swelling or fevers please seek care immediately.
